# Patient Record
Sex: Female | Born: 1965 | Race: White | Hispanic: No | Marital: Single | Smoking: Never smoker
Health system: Southern US, Community
[De-identification: ages and names within clinical notes are randomized; demographics above are authoritative.]

## PROBLEM LIST (undated history)

## (undated) DIAGNOSIS — K219 Gastro-esophageal reflux disease without esophagitis: Secondary | ICD-10-CM

## (undated) DIAGNOSIS — H3500 Unspecified background retinopathy: Secondary | ICD-10-CM

## (undated) DIAGNOSIS — G4730 Sleep apnea, unspecified: Secondary | ICD-10-CM

## (undated) DIAGNOSIS — F419 Anxiety disorder, unspecified: Secondary | ICD-10-CM

## (undated) DIAGNOSIS — Z803 Family history of malignant neoplasm of breast: Secondary | ICD-10-CM

## (undated) DIAGNOSIS — Z87442 Personal history of urinary calculi: Secondary | ICD-10-CM

## (undated) DIAGNOSIS — G629 Polyneuropathy, unspecified: Secondary | ICD-10-CM

## (undated) DIAGNOSIS — Z1379 Encounter for other screening for genetic and chromosomal anomalies: Secondary | ICD-10-CM

## (undated) DIAGNOSIS — R0600 Dyspnea, unspecified: Secondary | ICD-10-CM

## (undated) DIAGNOSIS — F329 Major depressive disorder, single episode, unspecified: Secondary | ICD-10-CM

## (undated) DIAGNOSIS — E119 Type 2 diabetes mellitus without complications: Secondary | ICD-10-CM

## (undated) DIAGNOSIS — I739 Peripheral vascular disease, unspecified: Secondary | ICD-10-CM

## (undated) DIAGNOSIS — E785 Hyperlipidemia, unspecified: Secondary | ICD-10-CM

## (undated) DIAGNOSIS — Z136 Encounter for screening for cardiovascular disorders: Secondary | ICD-10-CM

## (undated) DIAGNOSIS — M1990 Unspecified osteoarthritis, unspecified site: Secondary | ICD-10-CM

## (undated) DIAGNOSIS — F32A Depression, unspecified: Secondary | ICD-10-CM

## (undated) HISTORY — DX: Family history of malignant neoplasm of breast: Z80.3

## (undated) HISTORY — PX: COLONOSCOPY: SHX174

## (undated) HISTORY — PX: BREAST SURGERY: SHX581

## (undated) HISTORY — PX: OTHER SURGICAL HISTORY: SHX169

## (undated) HISTORY — PX: WISDOM TOOTH EXTRACTION: SHX21

## (undated) HISTORY — DX: Encounter for other screening for genetic and chromosomal anomalies: Z13.79

## (undated) HISTORY — PX: UPPER GI ENDOSCOPY: SHX6162

---
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
## 2011-10-21 ENCOUNTER — Encounter (HOSPITAL_COMMUNITY): Payer: Self-pay | Admitting: *Deleted

## 2011-10-21 ENCOUNTER — Emergency Department (HOSPITAL_COMMUNITY): Payer: BC Managed Care – PPO

## 2011-10-21 ENCOUNTER — Emergency Department (HOSPITAL_COMMUNITY)
Admission: EM | Admit: 2011-10-21 | Discharge: 2011-10-21 | Disposition: A | Discharge status: Home / Self Care | Payer: BC Managed Care – PPO | Attending: Emergency Medicine | Admitting: Emergency Medicine

## 2011-10-21 DIAGNOSIS — N839 Noninflammatory disorder of ovary, fallopian tube and broad ligament, unspecified: Secondary | ICD-10-CM | POA: Insufficient documentation

## 2011-10-21 DIAGNOSIS — R739 Hyperglycemia, unspecified: Secondary | ICD-10-CM

## 2011-10-21 DIAGNOSIS — R7309 Other abnormal glucose: Secondary | ICD-10-CM | POA: Insufficient documentation

## 2011-10-21 DIAGNOSIS — R109 Unspecified abdominal pain: Secondary | ICD-10-CM

## 2011-10-21 DIAGNOSIS — R1011 Right upper quadrant pain: Secondary | ICD-10-CM | POA: Insufficient documentation

## 2011-10-21 DIAGNOSIS — N838 Other noninflammatory disorders of ovary, fallopian tube and broad ligament: Secondary | ICD-10-CM

## 2011-10-21 DIAGNOSIS — N200 Calculus of kidney: Secondary | ICD-10-CM

## 2011-10-21 DIAGNOSIS — N1330 Unspecified hydronephrosis: Secondary | ICD-10-CM | POA: Insufficient documentation

## 2011-10-21 LAB — CBC
MCV: 90.2 fL (ref 78.0–100.0)
Platelets: 226 10*3/uL (ref 150–400)
RBC: 4.6 MIL/uL (ref 3.87–5.11)
WBC: 13 10*3/uL — ABNORMAL HIGH (ref 4.0–10.5)

## 2011-10-21 LAB — COMPREHENSIVE METABOLIC PANEL
ALT: 16 U/L (ref 0–35)
Alkaline Phosphatase: 77 U/L (ref 39–117)
CO2: 21 mEq/L (ref 19–32)
Chloride: 98 mEq/L (ref 96–112)
GFR calc Af Amer: >90 mL/min (ref >90)
GFR calc non Af Amer: >90 mL/min (ref >90)
Glucose, Bld: 289 mg/dL — ABNORMAL HIGH (ref 70–99)
Potassium: 4.8 mEq/L (ref 3.5–5.1)
Sodium: 135 mEq/L (ref 135–145)

## 2011-10-21 LAB — URINE MICROSCOPIC-ADD ON

## 2011-10-21 LAB — URINALYSIS, ROUTINE W REFLEX MICROSCOPIC
Bilirubin Urine: NEGATIVE
Specific Gravity, Urine: 1.029 (ref 1.005–1.030)
Urobilinogen, UA: 0.2 mg/dL (ref 0.0–1.0)

## 2011-10-21 LAB — DIFFERENTIAL
Lymphocytes Relative: 12 % (ref 12–46)
Lymphs Abs: 1.6 10*3/uL (ref 0.7–4.0)
Neutrophils Relative %: 78 % — ABNORMAL HIGH (ref 43–77)

## 2011-10-21 MED ORDER — MORPHINE SULFATE 4 MG/ML IJ SOLN
4.0000 mg | Freq: Once | INTRAMUSCULAR | Status: AC
  Administered 2011-10-21: 4 mg via INTRAVENOUS
  Filled 2011-10-21: qty 1

## 2011-10-21 MED ORDER — METFORMIN HCL 500 MG PO TABS: 500.0000 mg | ORAL_TABLET | Freq: Two times a day (BID) | ORAL | Status: DC

## 2011-10-21 MED ORDER — ONDANSETRON HCL 4 MG PO TABS: 4.0000 mg | ORAL_TABLET | Freq: Four times a day (QID) | ORAL | Status: AC

## 2011-10-21 MED ORDER — TRAMADOL HCL 50 MG PO TABS: 50.0000 mg | ORAL_TABLET | Freq: Four times a day (QID) | ORAL | Status: AC | PRN

## 2011-10-21 NOTE — ED Notes (Signed)
Pt returned from [place]. Pt advised to remain NPO until after [place] results.

## 2011-10-21 NOTE — Discharge Instructions (Signed)
You have been evaluated for your flank pain. CT scan shows evidence of a recently passed kidney stone which made be related to your pain. Your pain should improve. The scan also shows an undifferentiated mass in your ovary.  A pelvic ultrasound was performed showing a hemorrhagic ovarian cyst.  [facility] to recheck for resolution.  Your blood sugar is high today, please take metformin, monitor your diet, perform exercise at least 3 times weekly and follow up with a regular doctor for further care.  Resources given here.   Flank Pain Flank pain refers to pain that is located on the side of the body between the upper abdomen and the back. It can be caused by many things. CAUSES  Some of the more common causes of flank pain include:  Muscle strain.   Muscle spasms.   A disease of your spine (vertebral disk disease).   A lung infection (pneumonia).   Fluid around your lungs (pulmonary edema).   A kidney infection.   Kidney stones.   A very painful skin rash on only one side of your body (shingles).   Gallbladder disease.  DIAGNOSIS  Blood tests, urine tests, and X-rays may help your caregiver determine what is wrong. TREATMENT  The treatment of pain depends on the cause. Your caregiver will determine what treatment will work best for you. HOME CARE INSTRUCTIONS   Home care will depend on the cause of your pain.   Some medications may help relieve the pain. Take medication for relief of pain as directed by your caregiver.   Tell your caregiver about any changes in your pain.   Follow up with your caregiver.  SEEK IMMEDIATE MEDICAL CARE IF:   Your pain is not controlled with medication.   The pain increases.   You have abdominal pain.   You have shortness of breath.   You have persistent nausea or vomiting.   You have swelling in your abdomen.   You feel faint or pass out.   You have a temperature by mouth above 102 F (38.9 C),  not controlled by medicine.  MAKE SURE YOU:   Understand these instructions.   Will watch your condition.   Will get help right away if you are not doing well or get worse.  Document Released: 06/21/2005 Document Revised: 04/19/2011 Document Reviewed: 10/15/2009 [facility] Patient Information 2012 [place], [place].  RESOURCE GUIDE  Chronic Pain Problems: Contact [facility]  [phone] Patients need to be referred by their primary care doctor.  Insufficient Money for Medicine: Contact United Way:  call "211" or [facility] 4026312805.  No Primary Care Doctor: - Call [facility]  [phone] - can help you locate a primary care doctor that  accepts your insurance, provides certain services, etc. - Physician Referral [phone]  Agencies that provide inexpensive medical care: - [facility]  [phone] - [facility]  [phone] - [facility]  [phone] [facility]  [phone] - Planned Parenthood  [phone] [facility]  [phone]  Medicaid-accepting [facility] Providers: - [facility]- [address]  [phone], Mon-Fri 9am-7pm, Sat 9am-1pm - [facility]- [address] [place], [address] [place]  [phone] - [facility]- [address], [address] [place]  [phone] [facility]- [address]  [phone] - Renaye Rakers- [address]  [address], [phone]  Only accepts [place] [employer] patients after they have their name  applied to their card  Self Pay (no insurance) in [facility]: - Sickle Cell Patients: Dr Willey Blade, [facility]  [address] [place], [phone] - [facility]- [address] [place]  [phone]       [facility] [place]- [address]       -     [facility]- see information above (Speak to [employer]  if you do not have insurance)       -  [facility]- [address] [place], [phone]       -  [facility]- [address],  [phone]       -  [facility]- [address] [phone]       -  Dr Julio Sicks-  [address], [place], [phone]       -  [facility]- [address] [phone]       -  [facility]- [address], [phone], also [address] [phone]       -    [facility]- [address] [place], [phone], 1st & 3rd Saturday   every month, 10am-1pm  1) Find a Doctor and Pay Out of Pocket Although you won't have to find out who is covered by your insurance plan, it is a good idea to ask around and get recommendations. You will then need to call the office and see if the doctor you have chosen will accept you as a new patient and what types of options they offer for patients who are self-pay. Some doctors offer discounts or will set up payment plans for their patients who do not have insurance, but you will need to ask so you aren't surprised when you get to your appointment.  2) Contact Your Local Health Department Not all health departments have doctors that can see patients for sick visits, but many do, so it is worth a call to see if yours does. If you don't know where your local health department is, you can check in your phone book. The CDC also has a tool to help you locate your state's health department, and many state websites also have listings of all of their local health departments.  3) Find a Walk-in Clinic If your illness is not likely to be very severe or complicated, you may want to try a walk in clinic. These are popping up all over the country in pharmacies, drugstores, and shopping centers. They're usually staffed by nurse practitioners or physician assistants that have been trained to treat common illnesses and complaints. They're usually fairly quick and inexpensive. However, if  you have serious medical issues or chronic medical problems, these are probably not your best option  STD Testing - [facility] Department of [facility], STD Clinic, [address] [place], phone [phone] or [phone].  Monday - Friday, call for an appointment. [facility] of [employer], STD Clinic, [address], [place], phone [phone] or [phone].  Monday - Friday, call for an appointment.  Abuse/Neglect: [facility] Child Abuse Hotline (432) [phone] [facility] Child Abuse Hotline (646)276-6654 (After Hours)  Emergency Shelter:  [facility] 220 726 2827  Maternity Homes: - Room at  the [place] [facility] [phone] - [facility] [phone]  MRSA Hotline #:   236 269 3544  [facility] Resources  [facility] [place]  United Way [facility] Dept. [address]                 [address]         [address] [place] [address]  Blondell Reveal Phone:  [phone]                                  Phone:  [phone]                   Phone:  [phone]  [facility], [phone] - [facility] - [phone]       -     [facility] [place] in [place], [address]                                  [phone], [facility] Child Abuse Hotline 2515136571 or 2795785800 (After Hours)   Behavioral Health Services  Substance Abuse Resources: - [facility] and Drug Services  (810) [phone] - [facility] [phone] - [facility] [phone] Floydene Flock [phone] - Residential & Outpatient Substance Abuse Program  [phone]  Psychological Services: [facility]  [phone] Services  [phone] - [facility], [phone] [place]. [address], [place], ACCESS LINE: [phone] or [phone], [email or web address]  Dental Assistance  If unable to pay or uninsured, contact:  [facility] or [facility]. to become qualified for the adult dental clinic.  Patients with Medicaid: [facility] [phone] [address], [phone] [address] [phone]  If unable to pay, or uninsured, contact [facility] [phone]) or [facility] [phone] in [place], [phone] in [facility]) to become qualified for the adult dental clinic  Other Low-Cost Community Dental Services: - [facility]- [address] [place], [place], [place], [address], [phone], [address]. [phone], 2nd and 4th Thursday of the month at 6:30am.  10 clients each day by appointment, can sometimes see walk-in patients if someone does not show for an appointment. [facility]- [address] [place], [place], [address], [phone] - [facility]- [address] [place], [place], [address], [phone] - [facility]- [phone] [facility] Health Department- [phone] [facility] [phone] -

## 2011-10-21 NOTE — ED Notes (Signed)
Reports having intermittent right side flank pain for several days, having n/v. Denies urinary symptoms.

## 2011-10-21 NOTE — ED Notes (Signed)
Pt reporting right sided flank pain. Pain is intermittent. Right side tender to palpation. Denying any hematuria but just came off period so hard to assess per pt. Reporting nausea no vomiting. No hx of kidney stone

## 2011-10-21 NOTE — ED Provider Notes (Signed)
History     CSN: [id number]  Arrival date & time 10/21/11  1455   First MD Initiated Contact with Patient 10/21/11 1709      Chief Complaint  Patient presents with  . Flank Pain    (Consider location/radiation/quality/duration/timing/severity/associated sxs/prior treatment) HPI  46 year old female presents complaining of right flank pain. Patient states for the past 2 days she has had intermittent right flank pain. Pain is described as sharp and achy sensations radiates to her right upper quadrant and to the right mid back.  Pain usually lasting for hours and resolved. Pain is not correlated with positional change or with eating. She has some associated nausea without vomiting, or diarrhea. She denies fever, headache, chills, chest pain, shortness of breath, urinary symptoms, or rash. Patient still has her gallbladder. Patient denies prior history of kidney stones. Patient has not tried anything to help alleviate the symptoms.  History reviewed. No pertinent past medical history.  History reviewed. No pertinent past surgical history.  History reviewed. No pertinent family history.  History  Substance Use Topics  . Smoking status: Not on file  . Smokeless tobacco: Not on file  . Alcohol Use: No    OB History    Grav Para Term Preterm Abortions TAB SAB Ect Mult Living                  Review of Systems  All other systems reviewed and are negative.    Allergies  Review of patient's allergies indicates no known allergies.  Home Medications   Current Outpatient Rx  Name Route Sig Dispense Refill  . ACETAMINOPHEN 500 MG PO TABS Oral Take 1,500 mg by mouth every 6 (six) hours as needed. For pain    . ASPIRIN-ACETAMINOPHEN-CAFFEINE 250-250-65 MG PO TABS Oral Take 1 tablet by mouth every 6 (six) hours as needed. For general pain    . PERCOCET PO Oral Take 1 tablet by mouth every 6 (six) hours as needed. For pain      BP 178/96  Pulse 105  Temp 98.3 F (36.8 C)  Resp  18  SpO2 98%  Physical Exam  Nursing note and vitals reviewed. Constitutional: She is oriented to person, place, and time. She appears well-nourished.       Morbidly obese  HENT:  Head: Normocephalic and atraumatic.  Mouth/Throat: Oropharynx is clear and moist. No oropharyngeal exudate.  Eyes: Conjunctivae are normal.  Neck: Neck supple.  Cardiovascular: Normal rate and regular rhythm.   Pulmonary/Chest: Effort normal and breath sounds normal. No respiratory distress. She has no wheezes. She has no rales. She exhibits no tenderness.  Abdominal: Soft. There is tenderness in the right upper quadrant. There is no rigidity, no rebound, no guarding, no CVA tenderness, no tenderness at McBurney's point and negative Murphy's sign.  Musculoskeletal: She exhibits no edema.  Neurological: She is alert and oriented to person, place, and time.  Skin: Skin is warm. No rash noted.    ED Course  Procedures (including critical care time)  Labs Reviewed  URINALYSIS, ROUTINE W REFLEX MICROSCOPIC - Abnormal; Notable for the following:    Color, Urine AMBER (*) BIOCHEMICALS MAY BE AFFECTED BY COLOR   APPearance CLOUDY (*)    Glucose, UA >1000 (*)    Hgb urine dipstick LARGE (*)    Ketones, ur >80 (*)    Protein, ur 100 (*)    Leukocytes, UA SMALL (*)    All other components within normal limits  URINE MICROSCOPIC-ADD ON  No results found.   No diagnosis found.  Results for orders placed during the hospital encounter of 10/21/11  URINALYSIS, ROUTINE W REFLEX MICROSCOPIC      Component Value Range   Color, Urine AMBER (*) YELLOW    APPearance CLOUDY (*) CLEAR    Specific Gravity, Urine 1.029  1.005 - 1.030    pH 5.5  5.0 - 8.0    Glucose, UA >1000 (*) NEGATIVE (mg/dL)   Hgb urine dipstick LARGE (*) NEGATIVE    Bilirubin Urine NEGATIVE  NEGATIVE    Ketones, ur >80 (*) NEGATIVE (mg/dL)   Protein, ur 409 (*) NEGATIVE (mg/dL)   Urobilinogen, UA 0.2  0.0 - 1.0 (mg/dL)   Nitrite NEGATIVE   NEGATIVE    Leukocytes, UA SMALL (*) NEGATIVE   URINE MICROSCOPIC-ADD ON      Component Value Range   Squamous Epithelial / LPF RARE  RARE    WBC, UA 7-10  <3 (WBC/hpf)   RBC / HPF TOO NUMEROUS TO COUNT  <3 (RBC/hpf)  PREGNANCY, URINE      Component Value Range   Preg Test, Ur NEGATIVE  NEGATIVE   CBC      Component Value Range   WBC 13.0 (*) 4.0 - 10.5 (K/uL)   RBC 4.60  3.87 - 5.11 (MIL/uL)   Hemoglobin 14.8  12.0 - 15.0 (g/dL)   HCT 81.1  91.4 - 78.2 (%)   MCV 90.2  78.0 - 100.0 (fL)   MCH 32.2  26.0 - 34.0 (pg)   MCHC 35.7  30.0 - 36.0 (g/dL)   RDW 95.6  21.3 - 08.6 (%)   Platelets 226  150 - 400 (K/uL)  DIFFERENTIAL      Component Value Range   Neutrophils Relative 78 (*) 43 - 77 (%)   Neutro Abs 10.1 (*) 1.7 - 7.7 (K/uL)   Lymphocytes Relative 12  12 - 46 (%)   Lymphs Abs 1.6  0.7 - 4.0 (K/uL)   Monocytes Relative 10  3 - 12 (%)   Monocytes Absolute 1.3 (*) 0.1 - 1.0 (K/uL)   Eosinophils Relative 0  0 - 5 (%)   Eosinophils Absolute 0.0  0.0 - 0.7 (K/uL)   Basophils Relative 0  0 - 1 (%)   Basophils Absolute 0.0  0.0 - 0.1 (K/uL)  COMPREHENSIVE METABOLIC PANEL      Component Value Range   Sodium 135  135 - 145 (mEq/L)   Potassium 4.8  3.5 - 5.1 (mEq/L)   Chloride 98  96 - 112 (mEq/L)   CO2 21  19 - 32 (mEq/L)   Glucose, Bld 289 (*) 70 - 99 (mg/dL)   BUN 9  6 - 23 (mg/dL)   Creatinine, Ser 5.78 (*) 0.50 - 1.10 (mg/dL)   Calcium 9.8  8.4 - 46.9 (mg/dL)   Total Protein 8.2  6.0 - 8.3 (g/dL)   Albumin 3.5  3.5 - 5.2 (g/dL)   AST 23  0 - 37 (U/L)   ALT 16  0 - 35 (U/L)   Alkaline Phosphatase 77  39 - 117 (U/L)   Total Bilirubin 0.3  0.3 - 1.2 (mg/dL)   GFR calc non Af Amer >90  >90 (mL/min)   GFR calc Af Amer >90  >90 (mL/min)  LIPASE, BLOOD      Component Value Range   Lipase 21  11 - 59 (U/L)   Ct Abdomen Pelvis Wo Contrast  10/21/2011  *RADIOLOGY REPORT*  Clinical Data:  Intermittent right-sided  flank pain for several days.  Nausea vomiting.  CT ABDOMEN AND  PELVIS WITHOUT CONTRAST (CT UROGRAM)  Technique: Contiguous axial images of the abdomen and pelvis without oral or intravenous contrast were obtained.  Comparison: None  Findings:  Exam is limited for evaluation of entities other than urinary tract calculi due to lack of oral or intravenous contrast.   Clear lung bases.  Normal heart size without pericardial or pleural effusion.  Moderate hepatic steatosis and hepatomegaly. 19.9 cm.  Normal spleen, stomach, pancreas, gallbladder, biliary tract, adrenal glands.  No renal calculi.  Moderate obstructive signs involve the right kidney.  Right hydroureter.  No obstructive ureteric stone identified.  No retroperitoneal or retrocrural adenopathy.  Normal colon, appendix, and terminal ileum.  Normal small bowel without abdominal ascites.  No pelvic adenopathy.  Normal urinary bladder.  Likely arising from the right ovary, within the right adnexa, is a 6.0 x 6.7 cm hyperattenuating "mass" on image 69.  There is low-density material adjacent which could represent ovarian parenchyma or less likely adnexal fluid.  Within the left adnexa/ovary, an area of hyperattenuation measures 3.5 cm on image 72.  No significant free fluid.  No acute osseous abnormality.  IMPRESSION:  1.  Mild to moderate right-sided urinary tract obstruction.  No obstructive stone identified.  Favor recent stone passage. 2.  Right adnexal/ovarian mass is indeterminate.  Recommend further evaluation with pelvic ultrasound.  This could be performed as an outpatient, or if there are symptoms attributable to the ovary, more acutely.  The hydronephrosis is not felt to be associated with the right ovarian lesion, given the distal mild right hydroureter. 3.  Probable hemorrhagic cyst in the left ovary.  This could also be evaluated at ultrasound. 4.  Hepatomegaly and hepatic steatosis.  Original Report Authenticated By: Consuello Bossier, M.D.   US Pelvis Complete Status:  Final result         PACS Images      Show images for US Pelvis Complete         Study Result     *RADIOLOGY REPORT*  Clinical Data: Adnexal mass on CT.  TRANSABDOMINAL ULTRASOUND OF PELVIS  Technique: Transabdominal ultrasound examination of the pelvis was  performed including evaluation of the uterus, ovaries, adnexal  regions, and pelvic cul-de-sac.  Comparison: CT of earlier today.  Findings: Uterus measures 9.0 x 4.3 x 5.3 cm. Equivocal anterior  lower uterine segment fibroid measuring 2.4 cm on image 53.  The endometrium is normal, 5 mm.  The right ovary measures 7.4 x 6.4 x 9.4 cm. Within the right  ovary is a hypoechoic avascular mildly heterogeneous lesion which  measures 6.5 x 5.5 x 6.1 cm.  The left ovary measures 5.7 x 3.4 x 3.5 cm. Within the left ovary  is a 2.3 cm area of hypoechogenicity.  No significant free fluid.  IMPRESSION:  1. The patient was evaluated only transabdominally. Transvaginal  imaging was refused by patient. This degrades the exam.  2. Dominant right ovarian mass which is favored to represent a  hemorrhagic cyst. Follow-up ultrasound at 6 - 12 weeks recommended  to confirm resolution.  3. Probable hemorrhagic follicle within the left ovary.  4. Possible small lower uterine segment fibroid.  Original Report Authenticated By: Consuello Bossier, M.D.      MDM  Patient with right flank pain. She has no CVA tenderness. She does have mild tenderness to her right upper quadrant. She is otherwise in no acute distress. Her urine is  significant for large amount of red blood cells, however the patient states she has her menstruation which ended a few days ago. She denies any urinary symptoms.  Pain to obtain an abdominal and pelvis CT scan to rule out kidney stones. If negative will consider abdominal ultrasound to rule out gallbladder etiology.  7:25 PM Abdominal and pelvis CT scan shows a mild to moderate right side urinary tract obstruction which favors a recent kidney stone passage.  This symptom is consistent with patient's present complaint. She is currently pain-free. There is also a right adnexal ovarian mass that was found on the CT scan. I recommend for patient to followup with an OB/GYN for further evaluation. Patient also has elevated blood sugar. She will need to be checked to rule out diabetes but this can be done through her primary care Dr. Strict return precautions were discussed.  9:49 PM Pelvic US depict the dominant right ovarian mass as a hemorrhagic cyst.  Recommend pt to f/u with GYN in 6-12 weeks for resolution.  Result discussed with pt.  Pt stable to be d/c.  Able to tolerates PO.     Fayrene Helper, PA-C 10/21/11 2152

## 2011-10-22 ENCOUNTER — Encounter (HOSPITAL_COMMUNITY): Payer: Self-pay | Admitting: *Deleted

## 2011-10-22 ENCOUNTER — Emergency Department (HOSPITAL_COMMUNITY)
Admission: EM | Admit: 2011-10-22 | Discharge: 2011-10-23 | Disposition: A | Discharge status: Home / Self Care | Payer: BC Managed Care – PPO | Attending: Emergency Medicine | Admitting: Emergency Medicine

## 2011-10-22 DIAGNOSIS — R7309 Other abnormal glucose: Secondary | ICD-10-CM | POA: Insufficient documentation

## 2011-10-22 DIAGNOSIS — N390 Urinary tract infection, site not specified: Secondary | ICD-10-CM

## 2011-10-22 DIAGNOSIS — R109 Unspecified abdominal pain: Secondary | ICD-10-CM

## 2011-10-22 DIAGNOSIS — Z87442 Personal history of urinary calculi: Secondary | ICD-10-CM | POA: Insufficient documentation

## 2011-10-22 DIAGNOSIS — R739 Hyperglycemia, unspecified: Secondary | ICD-10-CM

## 2011-10-22 LAB — URINE MICROSCOPIC-ADD ON

## 2011-10-22 LAB — URINALYSIS, ROUTINE W REFLEX MICROSCOPIC
Bilirubin Urine: NEGATIVE
Nitrite: NEGATIVE
Protein, ur: 30 mg/dL — AB
Specific Gravity, Urine: 1.034 — ABNORMAL HIGH (ref 1.005–1.030)
Urobilinogen, UA: 0.2 mg/dL (ref 0.0–1.0)

## 2011-10-22 NOTE — ED Notes (Signed)
C/o R flank and RUQ pain, seen here recently for the same, dx'd with kidney stones, returns for "not feeling better", c/o R flank pain, took pain pill at 2030 w/o relief.  Pain now has eased off while here. "Not sure if she has developed subsequent UTI". Mentions intermitant and severe pain and intermitant urinary sx. Describes scant urine output, retention, frequency, urgency. (denies: fever, nv, noticeable hematuria, dark urine ("it is lighter now") or dysuria).

## 2011-10-23 MED ORDER — CIPROFLOXACIN HCL 500 MG PO TABS
500.0000 mg | ORAL_TABLET | Freq: Once | ORAL | Status: AC
  Administered 2011-10-23: 500 mg via ORAL
  Filled 2011-10-23: qty 1

## 2011-10-23 MED ORDER — CIPROFLOXACIN HCL 500 MG PO TABS: 500.0000 mg | ORAL_TABLET | Freq: Two times a day (BID) | ORAL | Status: AC

## 2011-10-23 NOTE — Discharge Instructions (Signed)
Take ultram around the clock for the next 2 days.  Take antibiotics as prescribed.  Follow up with a local primary care doctor.  Return to the ER for worsening pain, fevers, vomiting, or other concerning symptoms.  Drink plenty of water, and/or cranberry juice that does not contain a lot of sugar.  Abdominal Pain Many things can cause belly (abdominal) pain. Most times, the belly pain is not dangerous. The amount of belly pain does not tell how serious the problem may be. Many cases of belly pain can be watched and treated at home. HOME CARE   Do not take medicines that help you go poop (laxatives) unless told to by your doctor.   Only take medicine as told by your doctor.   Eat or drink as told by your doctor. Your doctor will tell you if you should be on a special diet.  GET HELP RIGHT AWAY IF:   The pain does not go away.   You have a fever.   You keep throwing up (vomiting).   The pain changes and is only in the right or left part of the belly.   You have bloody or tarry looking poop.  MAKE SURE YOU:   Understand these instructions.   Will watch your condition.   Will get help right away if you are not doing well or get worse.  Document Released: 10/17/2007 Document Revised: 04/19/2011 Document Reviewed: 05/16/2009 [facility] Patient Information 2012 [place], [place].  Urinary Tract Infection Infections of the urinary tract can start in several places. A bladder infection (cystitis), a kidney infection (pyelonephritis), and a prostate infection (prostatitis) are different types of urinary tract infections (UTIs). They usually get better if treated with medicines (antibiotics) that kill germs. Take all the medicine until it is gone. You or your child may feel better in a few days, but TAKE ALL MEDICINE or the infection may not respond and may become more difficult to treat. HOME CARE INSTRUCTIONS   Drink enough water and fluids to keep the urine clear or pale yellow. Cranberry  juice is especially recommended, in addition to large amounts of water.   Avoid caffeine, tea, and carbonated beverages. They tend to irritate the bladder.   Alcohol may irritate the prostate.   Only take over-the-counter or prescription medicines for pain, discomfort, or fever as directed by your caregiver.  To prevent further infections:  Empty the bladder often. Avoid holding urine for long periods of time.   After a bowel movement, women should cleanse from front to back. Use each tissue only once.   Empty the bladder before and after sexual intercourse.  FINDING OUT THE RESULTS OF YOUR TEST Not all test results are available during your visit. If your or your child's test results are not back during the visit, make an appointment with your caregiver to find out the results. Do not assume everything is normal if you have not heard from your caregiver or the medical facility. It is important for you to follow up on all test results. SEEK MEDICAL CARE IF:   There is back pain.   Your baby is older than 3 months with a rectal temperature of 100.5 F (38.1 C) or higher for more than 1 day.   Your or your child's problems (symptoms) are no better in 3 days. Return sooner if you or your child is getting worse.  SEEK IMMEDIATE MEDICAL CARE IF:   There is severe back pain or lower abdominal pain.   You  or your child develops chills.   You have a fever.   Your baby is older than 3 months with a rectal temperature of 102 F (38.9 C) or higher.   Your baby is 31 months old or younger with a rectal temperature of 100.4 F (38 C) or higher.   There is nausea or vomiting.   There is continued burning or discomfort with urination.  MAKE SURE YOU:   Understand these instructions.   Will watch your condition.   Will get help right away if you are not doing well or get worse.  Document Released: 02/07/2005 Document Revised: 04/19/2011 Document Reviewed: 09/12/2006 [facility] Patient  Information 2012 [place], [place].

## 2011-10-23 NOTE — ED Provider Notes (Signed)
Medical screening examination/treatment/procedure(s) were conducted as a shared visit with non-physician practitioner(s) and myself.  I personally evaluated the patient during the encounter  Pt with right flank pain, is diabetic.  Lab workup showed UTI, but also showed R adnexal mass.  Abdominal ultrasound showed what appeared to be a right hemorrhagic ovarian cyst.  Rx for UTI, GYN F/U in 6-12 weeks to check for resolution of the ovarian cyst.    Carleene Cooper III, MD 10/23/11 [phone]

## 2011-10-24 LAB — URINE CULTURE

## 2011-10-24 NOTE — ED Provider Notes (Signed)
History     CSN: [id number]  Arrival date & time 10/22/11  2223   First MD Initiated Contact with Patient 10/23/11 0008      Chief Complaint  Patient presents with  . Flank Pain    Right    (Consider location/radiation/quality/duration/timing/severity/associated sxs/prior treatment) HPI 46 year old female presents emergency department complaining of right flank and right upper abdominal pain. Patient seen yesterday for same, thought to have passed a kidney stone recently. Patient reports she has had increasing dysuria over the last 24 hours, and feels she may have a urinary tract infection. Patient reports pain started up this evening around 6 PM. She took tramadol for the first time without significant improvement in her symptoms. Patient has reports pain has since eased off and she is feeling much more comfortable. She has had no fevers, vomiting. She is having normal bowel movements.   Past Medical History  Diagnosis Date  . Kidney stone     Past Surgical History  Procedure Date  . Tailbone fx & subsequent cyst with excision     No family history on file.  History  Substance Use Topics  . Smoking status: Not on file  . Smokeless tobacco: Not on file  . Alcohol Use: No    OB History    Grav Para Term Preterm Abortions TAB SAB Ect Mult Living                  Review of Systems  All other systems reviewed and are negative.    Allergies  Review of patient's allergies indicates no known allergies.  Home Medications   Current Outpatient Rx  Name Route Sig Dispense Refill  . METFORMIN HCL 500 MG PO TABS Oral Take 1 tablet (500 mg total) by mouth 2 (two) times daily with a meal. 30 tablet 0  . TRAMADOL HCL 50 MG PO TABS Oral Take 1 tablet (50 mg total) by mouth every 6 (six) hours as needed for pain. 15 tablet 0  . CIPROFLOXACIN HCL 500 MG PO TABS Oral Take 1 tablet (500 mg total) by mouth 2 (two) times daily. 14 tablet 0  . ONDANSETRON HCL 4 MG PO TABS Oral Take  1 tablet (4 mg total) by mouth every 6 (six) hours. 12 tablet 0    BP 147/86  Pulse 102  Temp 98.4 F (36.9 C) (Oral)  Resp 19  SpO2 95%  LMP 10/16/2011  Physical Exam  Nursing note and vitals reviewed. Constitutional: She appears well-developed and well-nourished. No distress.  HENT:  Head: Normocephalic and atraumatic.  Eyes: Conjunctivae and EOM are normal. Pupils are equal, round, and reactive to light.  Neck: Normal range of motion. Neck supple. No JVD present. No thyromegaly present.  Cardiovascular: Regular rhythm, normal heart sounds and intact distal pulses.        Tachycardia noted  Pulmonary/Chest: Effort normal and breath sounds normal. No stridor. No respiratory distress. She has no wheezes. She has no rales. She exhibits no tenderness.  Abdominal: Soft. Bowel sounds are normal. She exhibits no distension and no mass. There is no tenderness. There is no rebound and no guarding.  Musculoskeletal: Normal range of motion. She exhibits no edema and no tenderness.  Lymphadenopathy:    She has no cervical adenopathy.  Psychiatric: She has a normal mood and affect. Her behavior is normal. Judgment and thought content normal.    ED Course  Procedures (including critical care time)  Labs Reviewed  URINALYSIS, ROUTINE W REFLEX MICROSCOPIC -  Abnormal; Notable for the following:    APPearance CLOUDY (*)     Specific Gravity, Urine 1.034 (*)     Glucose, UA >1000 (*)     Hgb urine dipstick LARGE (*)     Ketones, ur 15 (*)     Protein, ur 30 (*)     Leukocytes, UA SMALL (*)     All other components within normal limits  URINE MICROSCOPIC-ADD ON - Abnormal; Notable for the following:    Squamous Epithelial / LPF FEW (*)     Bacteria, UA FEW (*)     All other components within normal limits  LAB REPORT - SCANNED   No results found.   1. Right flank pain   2. Urinary tract infection   3. Hyperglycemia       MDM  46 year old female whose urine now shows increasing  white blood cells. Urine culture is still pending. Will treat with Cipro. Patient to followup with primary care Dr. as previously scheduled.        Olivia Mackie, MD 10/24/11 [phone]

## 2011-10-25 NOTE — ED Notes (Signed)
+   Urine Patient treated with Cipro-sensitive to same-chart appended per protocol MD. 

## 2014-10-01 ENCOUNTER — Emergency Department (HOSPITAL_COMMUNITY): Payer: BLUE CROSS/BLUE SHIELD

## 2014-10-01 ENCOUNTER — Encounter (HOSPITAL_COMMUNITY): Payer: Self-pay | Admitting: Family Medicine

## 2014-10-01 ENCOUNTER — Emergency Department (HOSPITAL_COMMUNITY)
Admission: EM | Admit: 2014-10-01 | Discharge: 2014-10-01 | Disposition: A | Discharge status: Home / Self Care | Payer: BLUE CROSS/BLUE SHIELD | Attending: Emergency Medicine | Admitting: Emergency Medicine

## 2014-10-01 DIAGNOSIS — Z87442 Personal history of urinary calculi: Secondary | ICD-10-CM | POA: Insufficient documentation

## 2014-10-01 DIAGNOSIS — R1031 Right lower quadrant pain: Secondary | ICD-10-CM | POA: Diagnosis not present

## 2014-10-01 DIAGNOSIS — E1165 Type 2 diabetes mellitus with hyperglycemia: Secondary | ICD-10-CM

## 2014-10-01 DIAGNOSIS — R112 Nausea with vomiting, unspecified: Secondary | ICD-10-CM | POA: Insufficient documentation

## 2014-10-01 DIAGNOSIS — Z79899 Other long term (current) drug therapy: Secondary | ICD-10-CM | POA: Diagnosis not present

## 2014-10-01 DIAGNOSIS — R109 Unspecified abdominal pain: Secondary | ICD-10-CM

## 2014-10-01 DIAGNOSIS — Z3202 Encounter for pregnancy test, result negative: Secondary | ICD-10-CM | POA: Insufficient documentation

## 2014-10-01 LAB — CBC WITH DIFFERENTIAL/PLATELET
BASOS ABS: 0 10*3/uL (ref 0.0–0.1)
BASOS PCT: 0 % (ref 0–1)
Eosinophils Absolute: 0 10*3/uL (ref 0.0–0.7)
Eosinophils Relative: 0 % (ref 0–5)
HEMATOCRIT: 43.1 % (ref 36.0–46.0)
Hemoglobin: 15 g/dL (ref 12.0–15.0)
Lymphocytes Relative: 17 % (ref 12–46)
Lymphs Abs: 1.6 10*3/uL (ref 0.7–4.0)
MCH: 31.3 pg (ref 26.0–34.0)
MCHC: 34.8 g/dL (ref 30.0–36.0)
MCV: 90 fL (ref 78.0–100.0)
MONO ABS: 0.3 10*3/uL (ref 0.1–1.0)
Monocytes Relative: 3 % (ref 3–12)
NEUTROS PCT: 80 % — AB (ref 43–77)
Neutro Abs: 7.2 10*3/uL (ref 1.7–7.7)
Platelets: 223 10*3/uL (ref 150–400)
RBC: 4.79 MIL/uL (ref 3.87–5.11)
RDW: 13 % (ref 11.5–15.5)
WBC: 9.1 10*3/uL (ref 4.0–10.5)

## 2014-10-01 LAB — URINALYSIS, ROUTINE W REFLEX MICROSCOPIC
Bilirubin Urine: NEGATIVE
Glucose, UA: >1000 mg/dL — AB
Hgb urine dipstick: NEGATIVE
Ketones, ur: 40 mg/dL — AB
Leukocytes, UA: NEGATIVE
Nitrite: NEGATIVE
Protein, ur: NEGATIVE mg/dL
Specific Gravity, Urine: 1.037 — ABNORMAL HIGH (ref 1.005–1.030)
Urobilinogen, UA: 0.2 mg/dL (ref 0.0–1.0)
pH: 6 (ref 5.0–8.0)

## 2014-10-01 LAB — COMPREHENSIVE METABOLIC PANEL
ALBUMIN: 4 g/dL (ref 3.5–5.0)
ALK PHOS: 73 U/L (ref 38–126)
ALT: 30 U/L (ref 14–54)
AST: 42 U/L — AB (ref 15–41)
Anion gap: 11 (ref 5–15)
BILIRUBIN TOTAL: 1.4 mg/dL — AB (ref 0.3–1.2)
BUN: 15 mg/dL (ref 6–20)
CO2: 22 mmol/L (ref 22–32)
Calcium: 9.3 mg/dL (ref 8.9–10.3)
Chloride: 100 mmol/L — ABNORMAL LOW (ref 101–111)
Creatinine, Ser: 0.62 mg/dL (ref 0.44–1.00)
GFR calc Af Amer: >60 mL/min (ref >60)
GFR calc non Af Amer: >60 mL/min (ref >60)
Glucose, Bld: 383 mg/dL — ABNORMAL HIGH (ref 65–99)
Potassium: 5.5 mmol/L — ABNORMAL HIGH (ref 3.5–5.1)
Sodium: 133 mmol/L — ABNORMAL LOW (ref 135–145)
TOTAL PROTEIN: 7.2 g/dL (ref 6.5–8.1)

## 2014-10-01 LAB — URINE MICROSCOPIC-ADD ON

## 2014-10-01 LAB — LIPASE, BLOOD: Lipase: 30 U/L (ref 22–51)

## 2014-10-01 LAB — PREGNANCY, URINE: Preg Test, Ur: NEGATIVE

## 2014-10-01 MED ORDER — METFORMIN HCL 500 MG PO TABS
500.0000 mg | ORAL_TABLET | Freq: Two times a day (BID) | ORAL | Status: DC
Start: 2014-10-01 — End: 2017-03-27

## 2014-10-01 MED ORDER — ONDANSETRON HCL 4 MG/2ML IJ SOLN
4.0000 mg | Freq: Once | INTRAMUSCULAR | Status: AC
  Administered 2014-10-01: 4 mg via INTRAVENOUS
  Filled 2014-10-01: qty 2

## 2014-10-01 MED ORDER — IBUPROFEN 600 MG PO TABS: 600.0000 mg | ORAL_TABLET | Freq: Four times a day (QID) | ORAL | Status: DC | PRN

## 2014-10-01 MED ORDER — PROMETHAZINE HCL 25 MG/ML IJ SOLN
12.5000 mg | Freq: Once | INTRAMUSCULAR | Status: AC
  Administered 2014-10-01: 12.5 mg via INTRAVENOUS
  Filled 2014-10-01: qty 1

## 2014-10-01 MED ORDER — HYDROCODONE-ACETAMINOPHEN 5-325 MG PO TABS: 1.0000 | ORAL_TABLET | Freq: Four times a day (QID) | ORAL | Status: DC | PRN

## 2014-10-01 MED ORDER — KETOROLAC TROMETHAMINE 30 MG/ML IJ SOLN
30.0000 mg | Freq: Once | INTRAMUSCULAR | Status: AC
  Administered 2014-10-01: 30 mg via INTRAVENOUS
  Filled 2014-10-01: qty 1

## 2014-10-01 MED ORDER — HYDROMORPHONE HCL 1 MG/ML IJ SOLN
1.0000 mg | Freq: Once | INTRAMUSCULAR | Status: AC
  Administered 2014-10-01: 1 mg via INTRAVENOUS
  Filled 2014-10-01: qty 1

## 2014-10-01 MED ORDER — PROMETHAZINE HCL 25 MG PO TABS: 25.0000 mg | ORAL_TABLET | Freq: Three times a day (TID) | ORAL | Status: DC | PRN

## 2014-10-01 MED ORDER — ONDANSETRON HCL 4 MG PO TABS: 4.0000 mg | ORAL_TABLET | Freq: Three times a day (TID) | ORAL | Status: DC | PRN

## 2014-10-01 MED ORDER — SODIUM CHLORIDE 0.9 % IV BOLUS (SEPSIS)
1000.0000 mL | Freq: Once | INTRAVENOUS | Status: AC
  Administered 2014-10-01: 1000 mL via INTRAVENOUS

## 2014-10-01 NOTE — ED Provider Notes (Signed)
CSN: [id number]     Arrival date & time 10/01/14  0911 History   First MD Initiated Contact with Patient 10/01/14 [phone]     Chief Complaint  Patient presents with  . Abdominal Pain  . Back Pain  . Flank Pain     (Consider location/radiation/quality/duration/timing/severity/associated sxs/prior Treatment) HPI Isabella Galloway is a 49 year-old female with pmhx of previous kidney stone who presents to the ER complaining of several hours of R sided flank pain radiating to her R side of her abdomen.  Pt reports going to work this morning with a waxing and waning pain, which gradually progressed into constant pain.  Pt denies having any aggravating or alleviating factors.  She states she is unable to change the pain with change in position, ROM does not affect it.  Pt reports associated nausea, and vomiting.  Pt denies diarrhea, constipation, fever, dysuria.    Past Medical History  Diagnosis Date  . Kidney stone    Past Surgical History  Procedure Laterality Date  . Tailbone fx & subsequent cyst with excision     History reviewed. No pertinent family history. History  Substance Use Topics  . Smoking status: Never Smoker   . Smokeless tobacco: Not on file  . Alcohol Use: No   OB History    No data available     Review of Systems  Constitutional: Negative for fever.  HENT: Negative for trouble swallowing.   Eyes: Negative for visual disturbance.  Respiratory: Negative for shortness of breath.   Cardiovascular: Negative for chest pain.  Gastrointestinal: Positive for nausea, vomiting and abdominal pain.  Genitourinary: Positive for flank pain. Negative for dysuria.  Musculoskeletal: Negative for neck pain.  Skin: Negative for rash.  Neurological: Negative for dizziness, weakness and numbness.  Psychiatric/Behavioral: Negative.       Allergies  Review of patient's allergies indicates no known allergies.  Home Medications   Prior to Admission medications   Medication Sig  Start Date End Date Taking? Authorizing Provider  acetaminophen (TYLENOL) 325 MG tablet Take 650 mg by mouth every 6 (six) hours as needed for mild pain.   Yes Historical Provider, MD  HYDROcodone-acetaminophen (NORCO/VICODIN) 5-325 MG per tablet Take 1-2 tablets by mouth every 6 (six) hours as needed. 10/01/14   Dahlia Bailiff, PA-C  ibuprofen (ADVIL,MOTRIN) 600 MG tablet Take 1 tablet (600 mg total) by mouth every 6 (six) hours as needed. 10/01/14   Dahlia Bailiff, PA-C  metFORMIN (GLUCOPHAGE) 500 MG tablet Take 1 tablet (500 mg total) by mouth 2 (two) times daily with a meal. 10/01/14 10/01/15  Dahlia Bailiff, PA-C  ondansetron (ZOFRAN) 4 MG tablet Take 1 tablet (4 mg total) by mouth every 8 (eight) hours as needed for nausea or vomiting. 10/01/14   Dahlia Bailiff, PA-C  promethazine (PHENERGAN) 25 MG tablet Take 1 tablet (25 mg total) by mouth every 8 (eight) hours as needed for nausea or vomiting. 10/01/14   Dahlia Bailiff, PA-C   BP 139/72 mmHg  Pulse 102  Temp(Src) 99.3 F (37.4 C) (Rectal)  Resp 17  SpO2 96%  LMP 10/16/2011 Physical Exam  Constitutional: She is oriented to person, place, and time. She appears well-developed and well-nourished. No distress.  HENT:  Head: Normocephalic and atraumatic.  Mouth/Throat: Oropharynx is clear and moist. No oropharyngeal exudate.  Eyes: Right eye exhibits no discharge. Left eye exhibits no discharge. No scleral icterus.  Neck: Normal range of motion.  Cardiovascular: Normal rate, regular rhythm and normal heart sounds.  No murmur heard. Pulmonary/Chest: Effort normal and breath sounds normal. No respiratory distress.  Abdominal: Soft. There is tenderness. There is CVA tenderness. There is no rigidity, no guarding, no tenderness at McBurney's point and negative Murphy's sign.    Musculoskeletal: Normal range of motion. She exhibits no edema or tenderness.  Neurological: She is alert and oriented to person, place, and time. She has normal strength. No cranial nerve  deficit or sensory deficit. Coordination normal. GCS eye subscore is 4. GCS verbal subscore is 5. GCS motor subscore is 6.  Patient fully alert, answering questions appropriately in full, clear sentences. Cranial nerves II through XII grossly intact. Motor strength 5 out of 5 in all major muscle groups of upper and lower extremities. Distal sensation intact.   Skin: Skin is warm and dry. No rash noted. She is not diaphoretic.  Psychiatric: She has a normal mood and affect.  Nursing note and vitals reviewed.   ED Course  Procedures (including critical care time) Labs Review Labs Reviewed  CBC WITH DIFFERENTIAL/PLATELET - Abnormal; Notable for the following:    Neutrophils Relative % 80 (*)    All other components within normal limits  COMPREHENSIVE METABOLIC PANEL - Abnormal; Notable for the following:    Sodium 133 (*)    Potassium 5.5 (*)    Chloride 100 (*)    Glucose, Bld 383 (*)    AST 42 (*)    Total Bilirubin 1.4 (*)    All other components within normal limits  URINALYSIS, ROUTINE W REFLEX MICROSCOPIC - Abnormal; Notable for the following:    Specific Gravity, Urine 1.037 (*)    Glucose, UA >1000 (*)    Ketones, ur 40 (*)    All other components within normal limits  LIPASE, BLOOD  PREGNANCY, URINE  URINE MICROSCOPIC-ADD ON    Imaging Review Ct Renal Stone Study  10/01/2014   CLINICAL DATA:  Right flank pain, nausea and vomiting starting this morning, history of kidney stones  EXAM: CT ABDOMEN AND PELVIS WITHOUT CONTRAST  TECHNIQUE: Multidetector CT imaging of the abdomen and pelvis was performed following the standard protocol without IV contrast.  COMPARISON:  10/21/2011  FINDINGS: The lung bases are unremarkable. Mild hepatic fatty infiltration. Sagittal images of the spine are unremarkable. Small hiatal hernia. No intrahepatic biliary ductal dilatation. No calcified gallstones are noted within gallbladder. Unenhanced pancreas, spleen and adrenal glands are unremarkable.  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  No nephrolithiasis. No hydronephrosis or hydroureter. No calcified ureteral calculi are noted bilaterally.  No small bowel obstruction. No ascites or free air. Moderate stool noted in right colon and cecum. No pericecal inflammation. The terminal ileum is unremarkable.  The urinary bladder is unremarkable. No calcified calculi are noted within urinary bladder. There is a hyperdense ovoid lesion within right ovary measures 7.6 x 7 cm. On the prior exam this measures 6 x 6.8 cm. This may represent a hemorrhagic cyst or endometrioma. Less likely solid lesion. Further correlation with pelvic ultrasound and/or MRI is recommended. Again noted hyperdense lesion in left adnexa measures 3.3 cm. This may represent a hemorrhagic cyst. Correlation with pelvic ultrasound is recommended. Normal size uterus.  IMPRESSION: 1. No nephrolithiasis.  No hydronephrosis or hydroureter. 2. No calcified ureteral calculi. 3. No pericecal inflammation. 4. There is a hyperdense ovoid lesion within right ovary measures 7.6 x 7 cm. On the prior exam this measures 6 x 6.8 cm. This may represent a hemorrhagic cyst or endometrioma. Less likely solid lesion.  Further correlation with pelvic ultrasound and/or MRI is recommended. Again noted hyperdense lesion in left adnexa measures 3.3 cm. This may represent a hemorrhagic cyst. Correlation with pelvic ultrasound is recommended. Normal size uterus.   Electronically Signed   By: Lahoma Crocker M.D.   On: 10/01/2014 11:34     EKG Interpretation None      MDM   Final diagnoses:  Right flank pain  Non-intractable vomiting with nausea, vomiting of unspecified type  Hyperglycemia due to type 2 diabetes mellitus    Pt with nonspecific R flank pain.  Symptoms managed effectively in the ER.  On repeat exam there is no concern for acute abdomen and CT findings are nonspecific.  On repeat exam, pt's pain is more reproducible with movement and palpation.   Discussed with pt possibility of MSK back pain, and pt endorses lifting heavy boxes at work over the past 2 days which is unusual for her.  No neurological deficits and normal neuro exam. No loss of bowel or bladder control.  No concern for cauda equina.  No fever, night sweats, weight loss, h/o cancer, IVDU.  RICE protocol and pain medicine indicated and discussed with patient. No concern for cardiac or pulmonary etiology of pt's pain.  Wells Criteria for PE negative. Ovarian lesion again noted on CT which is slightly larger than 3 years ago.  No lower abdominal pain or tenderness, do not believe this is contributing to pt's s/s.  Pt states she did not f/u with OBGYN regarding this, and I strongly recommended f/u with OBGYN.  Will provide referral.  Pt's lab work is unremarkable for acute pathology.  Pt hemodynamically stable and in no acute distress, tolerating PO well.  Patient stable for discharge, we'll provide symptomatic therapy and strongly encouraged follow-up with primary care doctor. Return precautions discussed, patient verbalized understanding and agreement of this plan.  BP 139/72 mmHg  Pulse 102  Temp(Src) 99.3 F (37.4 C) (Rectal)  Resp 17  SpO2 96%  LMP 10/16/2011  Signed,  Dahlia Bailiff, PA-C 7:30 AM  Patient discussed with Dr. Christ Kick, MD   Dahlia Bailiff, PA-C 10/02/14 0730  Daleen Bo, MD 10/02/14 [phone]

## 2014-10-01 NOTE — ED Notes (Signed)
Pt here for right flank pain, back pain that started this am. sts some nausea. Denies urinary complaints.

## 2014-10-01 NOTE — ED Notes (Signed)
Pt stable, ambulatory, friend at bedside, states understanding of discharge instructions

## 2014-10-01 NOTE — Discharge Instructions (Signed)
Flank Pain Flank pain refers to pain that is located on the side of the body between the upper abdomen and the back. The pain may occur over a short period of time (acute) or may be long-term or reoccurring (chronic). It may be mild or severe. Flank pain can be caused by many things. CAUSES  Some of the more common causes of flank pain include:  Muscle strains.   Muscle spasms.   A disease of your spine (vertebral disk disease).   A lung infection (pneumonia).   Fluid around your lungs (pulmonary edema).   A kidney infection.   Kidney stones.   A very painful skin rash caused by the chickenpox virus (shingles).   Gallbladder disease.  Casey care will depend on the cause of your pain. In general,  Rest as directed by your caregiver.  Drink enough fluids to keep your urine clear or pale yellow.  Only take over-the-counter or prescription medicines as directed by your caregiver. Some medicines may help relieve the pain.  Tell your caregiver about any changes in your pain.  Follow up with your caregiver as directed. SEEK IMMEDIATE MEDICAL CARE IF:   Your pain is not controlled with medicine.   You have new or worsening symptoms.  Your pain increases.   You have abdominal pain.   You have shortness of breath.   You have persistent nausea or vomiting.   You have swelling in your abdomen.   You feel faint or pass out.   You have blood in your urine.  You have a fever or persistent symptoms for more than 2-3 days.  You have a fever and your symptoms suddenly get worse. MAKE SURE YOU:   Understand these instructions.  Will watch your condition.  Will get help right away if you are not doing well or get worse. Document Released: 06/21/2005 Document Revised: 01/23/2012 Document Reviewed: 12/13/2011 [facility] Patient Information 2015 [place], [place]. This information is not intended to replace advice given to you by your  health care provider. Make sure you discuss any questions you have with your health care provider.   Abdominal Pain, Women Abdominal (stomach, pelvic, or belly) pain can be caused by many things. It is important to tell your doctor:  The location of the pain.  Does it come and go or is it present all the time?  Are there things that start the pain (eating certain foods, exercise)?  Are there other symptoms associated with the pain (fever, nausea, vomiting, diarrhea)? All of this is helpful to know when trying to find the cause of the pain. CAUSES   Stomach: virus or bacteria infection, or ulcer.  Intestine: appendicitis (inflamed appendix), regional ileitis (Crohn's disease), ulcerative colitis (inflamed colon), irritable bowel syndrome, diverticulitis (inflamed diverticulum of the colon), or cancer of the stomach or intestine.  Gallbladder disease or stones in the gallbladder.  Kidney disease, kidney stones, or infection.  Pancreas infection or cancer.  Fibromyalgia (pain disorder).  Diseases of the female organs:  Uterus: fibroid (non-cancerous) tumors or infection.  Fallopian tubes: infection or tubal pregnancy.  Ovary: cysts or tumors.  Pelvic adhesions (scar tissue).  Endometriosis (uterus lining tissue growing in the pelvis and on the pelvic organs).  Pelvic congestion syndrome (female organs filling up with blood just before the menstrual period).  Pain with the menstrual period.  Pain with ovulation (producing an egg).  Pain with an IUD (intrauterine device, birth control) in the uterus.  Cancer of the  female organs.  Functional pain (pain not caused by a disease, may improve without treatment).  Psychological pain.  Depression. DIAGNOSIS  Your doctor will decide the seriousness of your pain by doing an examination.  Blood tests.  X-rays.  Ultrasound.  CT scan (computed tomography, special type of X-ray).  MRI (magnetic resonance  imaging).  Cultures, for infection.  Barium enema (dye inserted in the large intestine, to better view it with X-rays).  Colonoscopy (looking in intestine with a lighted tube).  Laparoscopy (minor surgery, looking in abdomen with a lighted tube).  Major abdominal exploratory surgery (looking in abdomen with a large incision). TREATMENT  The treatment will depend on the cause of the pain.   Many cases can be observed and treated at home.  Over-the-counter medicines recommended by your caregiver.  Prescription medicine.  Antibiotics, for infection.  Birth control pills, for painful periods or for ovulation pain.  Hormone treatment, for endometriosis.  Nerve blocking injections.  Physical therapy.  Antidepressants.  Counseling with a psychologist or psychiatrist.  Minor or major surgery. HOME CARE INSTRUCTIONS   Do not take laxatives, unless directed by your caregiver.  Take over-the-counter pain medicine only if ordered by your caregiver. Do not take aspirin because it can cause an upset stomach or bleeding.  Try a clear liquid diet (broth or water) as ordered by your caregiver. Slowly move to a bland diet, as tolerated, if the pain is related to the stomach or intestine.  Have a thermometer and take your temperature several times a day, and record it.  Bed rest and sleep, if it helps the pain.  Avoid sexual intercourse, if it causes pain.  Avoid stressful situations.  Keep your follow-up appointments and tests, as your caregiver orders.  If the pain does not go away with medicine or surgery, you may try:  Acupuncture.  Relaxation exercises (yoga, meditation).  Group therapy.  Counseling. SEEK MEDICAL CARE IF:   You notice certain foods cause stomach pain.  Your home care treatment is not helping your pain.  You need stronger pain medicine.  You want your IUD removed.  You feel faint or lightheaded.  You develop nausea and vomiting.  You  develop a rash.  You are having side effects or an allergy to your medicine. SEEK IMMEDIATE MEDICAL CARE IF:   Your pain does not go away or gets worse.  You have a fever.  Your pain is felt only in portions of the abdomen. The right side could possibly be appendicitis. The left lower portion of the abdomen could be colitis or diverticulitis.  You are passing blood in your stools (bright red or black tarry stools, with or without vomiting).  You have blood in your urine.  You develop chills, with or without a fever.  You pass out. MAKE SURE YOU:   Understand these instructions.  Will watch your condition.  Will get help right away if you are not doing well or get worse. Document Released: 02/25/2007 Document Revised: 09/14/2013 Document Reviewed: 03/17/2009 [facility] Patient Information 2015 [place], [place]. This information is not intended to replace advice given to you by your health care provider. Make sure you discuss any questions you have with your health care provider.  Hyperglycemia Hyperglycemia occurs when the glucose (sugar) in your blood is too high. Hyperglycemia can happen for many reasons, but it most often happens to people who do not know they have diabetes or are not managing their diabetes properly.  CAUSES  Whether you have  diabetes or not, there are other causes of hyperglycemia. Hyperglycemia can occur when you have diabetes, but it can also occur in other situations that you might not be as aware of, such as: Diabetes  If you have diabetes and are having problems controlling your blood glucose, hyperglycemia could occur because of some of the following reasons:  Not following your meal plan.  Not taking your diabetes medications or not taking it properly.  Exercising less or doing less activity than you normally do.  Being sick. Pre-diabetes  This cannot be ignored. Before people develop Type 2 diabetes, they almost always have "pre-diabetes." This  is when your blood glucose levels are higher than normal, but not yet high enough to be diagnosed as diabetes. Research has shown that some long-term damage to the body, especially the heart and circulatory system, may already be occurring during pre-diabetes. If you take action to manage your blood glucose when you have pre-diabetes, you may delay or prevent Type 2 diabetes from developing. Stress  If you have diabetes, you may be "diet" controlled or on oral medications or insulin to control your diabetes. However, you may find that your blood glucose is higher than usual in the hospital whether you have diabetes or not. This is often referred to as "stress hyperglycemia." Stress can elevate your blood glucose. This happens because of hormones put out by the body during times of stress. If stress has been the cause of your high blood glucose, it can be followed regularly by your caregiver. That way he/she can make sure your hyperglycemia does not continue to get worse or progress to diabetes. Steroids  Steroids are medications that act on the infection fighting system (immune system) to block inflammation or infection. One side effect can be a rise in blood glucose. Most people can produce enough extra insulin to allow for this rise, but for those who cannot, steroids make blood glucose levels go even higher. It is not unusual for steroid treatments to "uncover" diabetes that is developing. It is not always possible to determine if the hyperglycemia will go away after the steroids are stopped. A special blood test called an A1c is sometimes done to determine if your blood glucose was elevated before the steroids were started. SYMPTOMS  Thirsty.  Frequent urination.  Dry mouth.  Blurred vision.  Tired or fatigue.  Weakness.  Sleepy.  Tingling in feet or leg. DIAGNOSIS  Diagnosis is made by monitoring blood glucose in one or all of the following ways:  A1c test. This is a chemical found in  your blood.  Fingerstick blood glucose monitoring.  Laboratory results. TREATMENT  First, knowing the cause of the hyperglycemia is important before the hyperglycemia can be treated. Treatment may include, but is not be limited to:  Education.  Change or adjustment in medications.  Change or adjustment in meal plan.  Treatment for an illness, infection, etc.  More frequent blood glucose monitoring.  Change in exercise plan.  Decreasing or stopping steroids.  Lifestyle changes. HOME CARE INSTRUCTIONS   Test your blood glucose as directed.  Exercise regularly. Your caregiver will give you instructions about exercise. Pre-diabetes or diabetes which comes on with stress is helped by exercising.  Eat wholesome, balanced meals. Eat often and at regular, fixed times. Your caregiver or nutritionist will give you a meal plan to guide your sugar intake.  Being at an ideal weight is important. If needed, losing as little as 10 to 15 pounds may help improve  blood glucose levels. SEEK MEDICAL CARE IF:   You have questions about medicine, activity, or diet.  You continue to have symptoms (problems such as increased thirst, urination, or weight gain). SEEK IMMEDIATE MEDICAL CARE IF:   You are vomiting or have diarrhea.  Your breath smells fruity.  You are breathing faster or slower.  You are very sleepy or incoherent.  You have numbness, tingling, or pain in your feet or hands.  You have chest pain.  Your symptoms get worse even though you have been following your caregiver's orders.  If you have any other questions or concerns. Document Released: 10/24/2000 Document Revised: 07/23/2011 Document Reviewed: 08/27/2011 [facility] Patient Information 2015 [place], [place]. This information is not intended to replace advice given to you by your health care provider. Make sure you discuss any questions you have with your health care provider.   Emergency Department Resource  Guide 1) Find a Doctor and Pay Out of Pocket Although you won't have to find out who is covered by your insurance plan, it is a good idea to ask around and get recommendations. You will then need to call the office and see if the doctor you have chosen will accept you as a new patient and what types of options they offer for patients who are self-pay. Some doctors offer discounts or will set up payment plans for their patients who do not have insurance, but you will need to ask so you aren't surprised when you get to your appointment.  2) Contact Your Local Health Department Not all health departments have doctors that can see patients for sick visits, but many do, so it is worth a call to see if yours does. If you don't know where your local health department is, you can check in your phone book. The CDC also has a tool to help you locate your state's health department, and many state websites also have listings of all of their local health departments.  3) Find a [facility] If your illness is not likely to be very severe or complicated, you may want to try a walk in clinic. These are popping up all over the country in pharmacies, drugstores, and shopping centers. They're usually staffed by nurse practitioners or physician assistants that have been trained to treat common illnesses and complaints. They're usually fairly quick and inexpensive. However, if you have serious medical issues or chronic medical problems, these are probably not your best option.  No Primary Care Doctor: - Call Health Connect at  [phone] - they can help you locate a primary care doctor that  accepts your insurance, provides certain services, etc. - Physician Referral Service- [phone]  Chronic Pain Problems: Organization         Address  Phone   Notes  [facility]  [phone] Patients need to be referred by their primary care doctor.   Medication Assistance: Organization          Address  Phone   Notes  [facility] Medication [facility] [address] [place], [place] [address] [phone] --Must be a resident of [facility] -- Must have NO insurance coverage whatsoever (no Medicaid/ Medicare, etc.) -- The pt. MUST have a primary care doctor that directs their care regularly and follows them in the community   MedAssist  [phone]   [employer]  [phone]    Agencies that provide inexpensive medical care: Organization  Address  Phone   Notes  [place]  [phone]   [facility]    [phone]   [facility] [place], [place] [address] [phone]   [place] [address] [place] [place] [phone]   Planned Parenthood    [phone]   [facility]    [phone]   [place] and [place] [address], [place] Phone:  [phone], Fax:  [phone] Hours of Operation:  9 am - 6 pm, M-F.  Also accepts Medicaid/Medicare and self-pay.  [facility] for [place], [address], [place] Phone: [phone], Fax: [phone]. Hours of Operation:  8:30 am - 5:30 pm, M-F.  Also accepts Medicaid and self-pay.  [facility] [place] [address] [place] Phone: [phone]   [place], [place], [place] [phone], Ext. [phone] Mondays & Thursdays: 7-9 AM.  First 15 patients are seen on a first come, first serve basis.    [place] Providers:  Organization         Address  Phone   Notes  [facility] [address], [place] [phone] Also accepts self-pay patients.  [facility] [address], [place]  [phone]   [place], [address], [place] [phone]   [facility] [address] [place] [phone]   Lucianne Lei [address], [place]   [phone] Only accepts [place] Access [place] patients after they have their name applied to their card.   Self-Pay (no insurance) in [facility]:  Organization         Address  Phone   Notes  Sickle Cell Patients, [facility] [place] [phone]   [facility] [place] [phone]   [facility] [place]  [place], [place], [place] [phone]   [facility]/Dr. Osei-Bonsu  [address] [place] or [address], [place] [phone] Phone number for both [place] and [place] locations is the same.  [facility] [address] [place] [phone]   [facility] [address] [place] or [address] [phone]   [facility] [address] [place] [phone], phone; [phone], fax Sees patients 1st and 3rd Saturday of every month.  Must not qualify for public or private insurance (i.e. Medicaid, Medicare, [place] Health Choice, Veterans' Benefits)  Household income should be no more than 200% of the poverty level The clinic cannot treat you if you are pregnant or think you are pregnant  Sexually transmitted diseases are not treated at the clinic.    Dental Care: Organization         Address  Phone  Notes  [facility] of [facility] [place] [phone] Accepts children up to age 52 who are enrolled in [place] or [place]; pregnant women with a Medicaid card; and children who have applied for Medicaid or [place] Health Choice, but were declined, whose parents can pay a reduced fee at time of  service.  [facility] of [facility]  [address], [place] [phone] Accepts children up to age 6 who are enrolled in [place] or [place]; pregnant women with a Medicaid card; and children who have applied for Medicaid or [place] Health Choice, but were declined, whose parents can pay a reduced fee at time of service.  [facility] Adult [facility] Access PROGRAM  [place] [phone] Patients are seen by appointment only. Walk-ins are not accepted. [place] will see patients 26 years of age and older. Monday - Tuesday (8am-5pm) Most Wednesdays (8:30-5pm) $30 per visit, cash only  [facility] Adult Dental Access PROGRAM  [address], [facility] [phone] Patients are seen by appointment only. Walk-ins are not accepted. [place] will see patients 72 years of age and older. One Wednesday Evening (Monthly: Volunteer Based).  $30 per visit, cash only  [place]  [phone] for adults; Children under age 9, call Graduate Pediatric Dentistry at [phone]. Children aged 28-14, please call [phone] to request a pediatric application.  Dental services are provided in all areas of dental care including fillings, crowns and bridges, complete and partial dentures, implants, gum treatment, root canals, and extractions. Preventive care is also provided. Treatment is provided to both adults and children. Patients are selected via a lottery and there is often a waiting list.   [facility] [address], [place]  [phone] [email or web address]   [facility] [address] [place], [place] [phone], Ext. [phone] Second and Fourth Thursday of each month, opens at 6:30 AM; Clinic ends at 9 AM.  Patients are seen on a first-come first-served basis, and a limited number are seen during each clinic.   [facility]  [address] Hillard Danker [place], [place] [phone]   Eligibility Requirements You must  have lived in [place], [place], or [place] counties for at least the last three months.   You cannot be eligible for state or federal sponsored [employer], including [employer], [place], or [employer].   You generally cannot be eligible for healthcare insurance through your employer.    How to apply: Eligibility screenings are held every Tuesday and Wednesday afternoon from 1:00 pm until 4:00 pm. You do not need an appointment for the interview!  [facility] [address], [place], [place]   [place]  [place] Department  [place]  [phone]    Behavioral Health Resources in the Community: Intensive Outpatient Programs Organization         Address  Phone  Notes  [place]. [address], [place], [place] [phone]   [facility] [address], [place], [place]   ADS: Alcohol & Drug Svcs [address] [place], [place]   [place] [address],  [place], [place] or [phone]   Substance Abuse Resources Organization         Address  Phone  Notes  Alcohol and Drug Services  8503209753   [place]  [phone]   [place]  [phone]   Chinita Pester  [phone]   Residential & Outpatient Substance Abuse Program  [phone]   Psychological Services Organization         Address  Phone  Notes  [facility]  [place]   [place] [address], [place] or [phone]    Mobile Crisis Teams Organization         Address  Phone  Notes  [facility], Mobile Crisis Care Unit  [phone]   [facility]  [address] [place], [place]   [place] [address], [place] [phone]    Self-Help/Support Groups Organization         Address  Phone             Notes  [place]. of [place] - variety of support groups  [place] Call for more information  Narcotics Anonymous (NA), [facility] [address], [employer] [place]  2 meetings at this location   Special educational needs teacher         Address  Phone  Notes  ASAP Residential Treatment [place],    [place]  [phone]   [facility]  [address], [place] [address], [place], [place]   [place], [place] [phone] Admissions: 8am-3pm M-F  Incentives Substance [place] [address]    [place], [place] [phone]   [facility] [address] [place], [place], [place]   [facility] [address],  [place], [place]   Insight Programs - Intensive Outpatient [address] Kristeen [address], [place], [place]   [facility] ([place] [address].,  [place], [place] [phone] or [phone]   [facility]idential Treatment Services (RTS) [address], [place], [place] Accepts Medicaid  [facility] [place] [address]  [place] [phone] Substance Abuse/Addiction Treatment   [facility] Organization         Address  Phone  Notes  [facility]  [phone]   Domenic Schwab, PhD [address] Arlis Porta [place], [place]   [phone] or [phone]   [place], [place] [phone]   [facility] [address] [place], [place] [phone] Insurance/Medicaid/sponsorship through [facility] and Families [address], [address]                                    [place], [place] [phone] [place] [address], [place] [phone]    Dr. Adele Schilder  [phone]   [facility] [place] Dept. 1) [address], [place] 2) [place] 3)  [place] [address], [place] [phone]  [phone]   Eugenio Saenz [phone] or [phone] (After Hours)

## 2015-08-20 ENCOUNTER — Encounter (HOSPITAL_COMMUNITY): Payer: Self-pay | Admitting: Nurse Practitioner

## 2015-08-20 ENCOUNTER — Emergency Department (HOSPITAL_COMMUNITY)
Admission: EM | Admit: 2015-08-20 | Discharge: 2015-08-20 | Disposition: A | Discharge status: Home / Self Care | Payer: BLUE CROSS/BLUE SHIELD | Attending: Emergency Medicine | Admitting: Emergency Medicine

## 2015-08-20 DIAGNOSIS — Z7984 Long term (current) use of oral hypoglycemic drugs: Secondary | ICD-10-CM | POA: Insufficient documentation

## 2015-08-20 DIAGNOSIS — Z87442 Personal history of urinary calculi: Secondary | ICD-10-CM | POA: Diagnosis not present

## 2015-08-20 DIAGNOSIS — W228XXD Striking against or struck by other objects, subsequent encounter: Secondary | ICD-10-CM | POA: Diagnosis not present

## 2015-08-20 DIAGNOSIS — S0592XD Unspecified injury of left eye and orbit, subsequent encounter: Secondary | ICD-10-CM | POA: Diagnosis present

## 2015-08-20 DIAGNOSIS — Z23 Encounter for immunization: Secondary | ICD-10-CM | POA: Insufficient documentation

## 2015-08-20 DIAGNOSIS — S0502XD Injury of conjunctiva and corneal abrasion without foreign body, left eye, subsequent encounter: Secondary | ICD-10-CM

## 2015-08-20 MED ORDER — TETANUS-DIPHTH-ACELL PERTUSSIS 5-2.5-18.5 LF-MCG/0.5 IM SUSP
0.5000 mL | Freq: Once | INTRAMUSCULAR | Status: AC
  Administered 2015-08-20: 0.5 mL via INTRAMUSCULAR
  Filled 2015-08-20: qty 0.5

## 2015-08-20 MED ORDER — ONDANSETRON 4 MG PO TBDP
4.0000 mg | ORAL_TABLET | Freq: Once | ORAL | Status: AC
  Administered 2015-08-20: 4 mg via ORAL
  Filled 2015-08-20: qty 1

## 2015-08-20 MED ORDER — FLUORESCEIN SODIUM 1 MG OP STRP
1.0000 | ORAL_STRIP | Freq: Once | OPHTHALMIC | Status: AC
  Administered 2015-08-20: 1 via OPHTHALMIC
  Filled 2015-08-20: qty 1

## 2015-08-20 MED ORDER — ONDANSETRON 4 MG PO TBDP: 4.0000 mg | ORAL_TABLET | Freq: Three times a day (TID) | ORAL | Status: DC | PRN

## 2015-08-20 MED ORDER — NAPHAZOLINE-PHENIRAMINE 0.025-0.3 % OP SOLN: 1.0000 [drp] | OPHTHALMIC | Status: DC | PRN

## 2015-08-20 MED ORDER — TETRACAINE HCL 0.5 % OP SOLN
2.0000 [drp] | Freq: Once | OPHTHALMIC | Status: AC
  Administered 2015-08-20: 2 [drp] via OPHTHALMIC
  Filled 2015-08-20: qty 2

## 2015-08-20 NOTE — ED Notes (Signed)
She scratched her L eye this morning with a sheet. [facility] was seen [facility] and was diagnosed with corneal abrasion this afternoon. Since she left [facility] she reports the pain in the eye has gotten worse and it is not relieved by her percocet

## 2015-08-20 NOTE — ED Provider Notes (Signed)
CSN: [id number]     Arrival date & time 08/20/15  1832 History   First MD Initiated Contact with Patient 08/20/15 1848     Chief Complaint  Patient presents with  . Eye Injury     (Consider location/radiation/quality/duration/timing/severity/associated sxs/prior Treatment) HPI   Isabella Galloway is a 50 year old female who presents to the ED today complaining of left eye pain. Patient states this morning her mother had fallen on the ground. She was attempting to catch her and instead grabbed the sheet and when she pulled back on at the sheet scratched her left eye. Patient now has burning in her left eye and states that "it feels like there is a bunch of sand or eyelashes in my eye". Patient was seen at another ER prior to coming to this emergency room. She states that she was diagnosed with corneal abrasion and was given antibiotic eye ointment and Percocet. Patient states that her pain is increased since she left the ER and wants to know if there are eyedrops that she can have to help with her pain. Patient is scheduled to follow up with her ophthalmologist on either Monday or Tuesday. Unknown last tetanus vaccine. Denies fevers, chills, pain with eye movement, redness or swelling around her eye.  Past Medical History  Diagnosis Date  . Kidney stone    Past Surgical History  Procedure Laterality Date  . Tailbone fx & subsequent cyst with excision     History reviewed. No pertinent family history. Social History  Substance Use Topics  . Smoking status: Never Smoker   . Smokeless tobacco: None  . Alcohol Use: No   OB History    No data available     Review of Systems  All other systems reviewed and are negative.     Allergies  Review of patient's allergies indicates no known allergies.  Home Medications   Prior to Admission medications   Medication Sig Start Date End Date Taking? Authorizing Provider  acetaminophen (TYLENOL) 325 MG tablet Take 650 mg by mouth every 6  (six) hours as needed for mild pain.    Historical Provider, MD  HYDROcodone-acetaminophen (NORCO/VICODIN) 5-325 MG per tablet Take 1-2 tablets by mouth every 6 (six) hours as needed. 10/01/14   Dahlia Bailiff, PA-C  ibuprofen (ADVIL,MOTRIN) 600 MG tablet Take 1 tablet (600 mg total) by mouth every 6 (six) hours as needed. 10/01/14   Dahlia Bailiff, PA-C  metFORMIN (GLUCOPHAGE) 500 MG tablet Take 1 tablet (500 mg total) by mouth 2 (two) times daily with a meal. 10/01/14 10/01/15  Dahlia Bailiff, PA-C  ondansetron (ZOFRAN) 4 MG tablet Take 1 tablet (4 mg total) by mouth every 8 (eight) hours as needed for nausea or vomiting. 10/01/14   Dahlia Bailiff, PA-C  promethazine (PHENERGAN) 25 MG tablet Take 1 tablet (25 mg total) by mouth every 8 (eight) hours as needed for nausea or vomiting. 10/01/14   Dahlia Bailiff, PA-C   BP 154/108 mmHg  Pulse 96  Temp(Src) 98 F (36.7 C) (Oral)  Resp 17  SpO2 99%  LMP 10/16/2011 Physical Exam  Constitutional: She is oriented to person, place, and time. She appears well-developed and well-nourished. No distress.  HENT:  Head: Normocephalic and atraumatic.  Eyes: EOM and lids are normal. Pupils are equal, round, and reactive to light. Lids are everted and swept, no foreign bodies found. Right eye exhibits no discharge. Left eye exhibits no discharge. Right conjunctiva is not injected. Right conjunctiva has no hemorrhage. Left conjunctiva is  injected. Left conjunctiva has no hemorrhage. No scleral icterus.  Slit lamp exam:      The left eye shows corneal abrasion and fluorescein uptake. The left eye shows no foreign body, no hyphema and no anterior chamber bulge.  No signs of ruptured globe.  Cardiovascular: Normal rate.   Pulmonary/Chest: Effort normal.  Neurological: She is alert and oriented to person, place, and time. Coordination normal.  Skin: Skin is warm and dry. No rash noted. She is not diaphoretic. No erythema. No pallor.  Psychiatric: She has a normal mood and affect. Her behavior  is normal.  Nursing note and vitals reviewed.   ED Course  Procedures (including critical care time) Labs Review Labs Reviewed - No data to display  Imaging Review No results found. I have personally reviewed and evaluated these images and lab results as part of my medical decision-making.   EKG Interpretation None      MDM   Final diagnoses:  Corneal abrasion, left, subsequent encounter   50 year old female presents to the ED with corneal abrasion that occurred this morning. Patient was seen and evaluated this at another emergency Department prior to arrival. She was given erythromycin ointment and Percocet for pain. Patient also has ophthalmology follow-up in 2 days. However, patient states her pain is not improved and is requesting eyedrops. I repeated a florist seen stain exam today which does reveal left corneal abrasion. Tdap given. Eye irrigated w NS, no evidence of FB.  No change in vision, acuity equal bilaterally.  Pt is not a contact lens wearer.  Exam non-concerning for orbital cellulitis, hyphema, corneal ulcers. Patient is a 23 been given appropriate therapy by previous provider however, will provide additional lubricant eye drops for symptomatic relief.   Patient understands to follow up with ophthalmology, & to return to ER if new symptoms develop including change in vision, purulent drainage, or entrapment.      Dondra Spry [place], PA-C 08/22/15 [id number]  Milton Ferguson, MD 08/23/15 [phone]

## 2015-08-20 NOTE — Discharge Instructions (Signed)
Follow-up with your ophthalmologist as soon as possible for reevaluation. Apply erythromycin ointment to your eye several times daily for lubrication. Use eye drops as needed. Take Percocet as needed for pain. Return to the emergency department if you experience redness or swelling around her eye, pain behind her eye with eye movement, fevers, chills.

## 2016-10-04 ENCOUNTER — Ambulatory Visit (INDEPENDENT_AMBULATORY_CARE_PROVIDER_SITE_OTHER): Payer: BLUE CROSS/BLUE SHIELD | Admitting: Cardiovascular Disease

## 2016-10-04 ENCOUNTER — Encounter: Payer: Self-pay | Admitting: Cardiovascular Disease

## 2016-10-04 VITALS — BP 140/78 | HR 106 | Ht 62.0 in | Wt 195.0 lb

## 2016-10-04 DIAGNOSIS — E785 Hyperlipidemia, unspecified: Secondary | ICD-10-CM

## 2016-10-04 DIAGNOSIS — E669 Obesity, unspecified: Secondary | ICD-10-CM

## 2016-10-04 DIAGNOSIS — I2510 Atherosclerotic heart disease of native coronary artery without angina pectoris: Secondary | ICD-10-CM

## 2016-10-04 DIAGNOSIS — R0602 Shortness of breath: Secondary | ICD-10-CM | POA: Diagnosis not present

## 2016-10-04 DIAGNOSIS — E1169 Type 2 diabetes mellitus with other specified complication: Secondary | ICD-10-CM

## 2016-10-04 DIAGNOSIS — Z79899 Other long term (current) drug therapy: Secondary | ICD-10-CM

## 2016-10-04 MED ORDER — ROSUVASTATIN CALCIUM 10 MG PO TABS: 10.0000 mg | ORAL_TABLET | Freq: Every day | ORAL | 3 refills | Status: DC

## 2016-10-04 NOTE — Patient Instructions (Signed)
Medication Instructions: Dr Sallyanne Kuster has recommended making the following medication changes: 1. START Rosuvastatin 10 mg - take 1 tablet by mouth daily  Labwork: Your physician recommends that you return for lab work in 3 months - FASTING.  Testing/Procedures: 1. Echocardiogram  - Your physician has requested that you have an echocardiogram. Echocardiography is a painless test that uses sound waves to create images of your heart. It provides your doctor with information about the size and shape of your heart and how well your heart's chambers and valves are working. This procedure takes approximately one hour. There are no restrictions for this procedure.  2. Exercise Tolerance Test - Your physician has requested that you have an exercise tolerance test. For further information please visit HugeFiesta.[email or web address]. Please also follow instruction sheet, as given.  These tests have been ordered to be performed at our [facility] location - [address], [address].  Follow-up: Dr Sallyanne Kuster recommends that you schedule a follow-up appointment in 3 months.  If you need a refill on your cardiac medications before your next appointment, please call your pharmacy.

## 2016-10-04 NOTE — Progress Notes (Signed)
Cardiology Consultation Note:    Date:  10/05/2016   ID:  ALLIAH BOULANGER, DOB 07-21-1965, MRN [id number]  PCP:  Gennette Pac, NP  Cardiologist:  Sanda Klein, MD    Referring MD: Gennette Pac, NP   Chief Complaint  Patient presents with  . Follow-up    New patient.  . Edema    Legs.   Isabella Galloway is a 51 y.o. female who is being seen today for the evaluation of Her artery calcification seen on CT scan, at the request of Carlis Abbott, Felipa Evener, NP.   History of Present Illness:    Isabella Galloway is a 51 y.o. female with a hx of Hyperlipidemia and mild diabetes mellitus, referred for evaluation of coronary calcification seen on CT scan: showed extensive calcification in the LAD artery.  I have known Isabella Galloway for a few years since she would always accompany her mother Isabella Galloway to office appointments. Her mother recently passed away due to severe pulmonary and cardiac problems.  Isabella Galloway has really not been taking care of herself, since she was so preoccupied with her mother's illnesses. She reports that for at least a year now she has had shortness of breath climbing a flight of stairs at work. She develops chest pain when she is emotional, for example when her mother was ill, but does not have chest pain usually with physical activity. More recently she has developed a lot of lower extremity edema. This is not particular prominent today, but sometimes her shoes become very uncomfortable due to swelling. She has to wear steel toed boots at work. 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 The swelling was prominent enough that Dr. Carlis Abbott ordered lower extremity venous Dopplers. This did not show evidence of DVT or venous reflux.  Recent laboratory tests show that she has severe uncontrolled diabetes mellitus with a hemoglobin A1c of 13.9%. She also has mixed hyperlipidemia with severely elevated LDL  cholesterol. Total cholesterol 274, LDL 197, HDL 55. He has normal renal parameters with a creatinine of 0.5. The fasting glucose was 284.  Past Medical History:  Diagnosis Date  . Kidney stone     Past Surgical History:  Procedure Laterality Date  . PILONIDAL CYST DRAINAGE  1980  . tailbone fx & subsequent cyst with excision      Current Medications: Current Meds  Medication Sig  . clonazePAM (KLONOPIN) 0.5 MG tablet Take 0.5 mg by mouth 2 (two) times daily.  . cyclobenzaprine (FLEXERIL) 5 MG tablet Take 5-10 mg by mouth 3 (three) times daily as needed for muscle spasms.  Marland Kitchen escitalopram (LEXAPRO) 10 MG tablet Take 10 mg by mouth daily.  . meloxicam (MOBIC) 15 MG tablet Take 15 mg by mouth daily.  Marland Kitchen omeprazole (PRILOSEC) 40 MG capsule Take 40 mg by mouth daily.  . traZODone (DESYREL) 50 MG tablet Take 50 mg by mouth at bedtime.     Allergies:   Patient has no known allergies.   Social History   Social History  . Marital status: Single    Spouse name: N/A  . Number of children: N/A  . Years of education: N/A   Social History Main Topics  . Smoking status: Never Smoker  . Smokeless tobacco: Never Used  . Alcohol use No  . Drug use: No  . Sexual activity: Not Asked   Other Topics Concern  . None   Social History Narrative  .  None     Family History: The patient's family history includes COPD in her mother; Cancer in her maternal grandmother and mother; Heart attack in her maternal grandfather; Heart failure in her mother. ROS:   Please see the history of present illness.     All other systems reviewed and are negative.  EKGs/Labs/Other Studies Reviewed:    The following studies were reviewed today: CT chest, venous ultrasound, labs and notes in care everywhere  EKG:  EKG is  ordered today.  The ekg ordered today demonstrates sinus tachycardia, mild poor R-wave progression with transition in V4, normal repolarization, QTC 454 ms  Recent Labs: Labs  09/07/2016 hemoglobin A1c 13.9%, Total cholesterol 274, LDL 197, HDL 55, TG 108. She has normal renal parameters with a creatinine of 0.5. The fasting glucose was 284.  Physical Exam:    VS:  BP 140/78   Pulse (!) 106   Ht 5\' 2"  (1.575 m)   Wt 195 lb (88.5 kg)   LMP 10/16/2011 (Exact Date)   BMI 35.67 kg/m     Wt Readings from Last 3 Encounters:  10/04/16 195 lb (88.5 kg)     GEN: Moderately obese, Well nourished, well developed in no acute distress HEENT: Normal NECK: No JVD; No carotid bruits LYMPHATICS: No lymphadenopathy CARDIAC: RRR, no murmurs, rubs, gallops RESPIRATORY:  Clear to auscultation without rales, wheezing or rhonchi  ABDOMEN: Soft, non-tender, non-distended MUSCULOSKELETAL:  Mild ankle swelling, a little more obvious on the left (where there is some pitting) rather than the right; No deformity  SKIN: Warm and dry NEUROLOGIC:  Alert and oriented x 3 PSYCHIATRIC:  Normal affect   ASSESSMENT:    1. Shortness of breath   2. Dyslipidemia   3. Medication management    PLAN:    In order of problems listed above:  1. Possible CHF: [name] is describing exertional dyspnea NYHA functional class II and has tachycardia at rest. I'm concerned that she may have congestive heart failure and have ordered an echocardiogram. Her blood pressure is not particularly high. No overt findings of hypervolemia on physical exam. If left ventricular systolic function is clearly abnormal or if there are regional wall motion abnormalities, proceed directly to coronary angiography. 2. Cor calcification: Her symptoms are mostly atypical, although they have some features suggestive of coronary insufficiency. There are no acute repolarization abnormalities on her ECG. Have recommended a treadmill stress test. This is abnormal, I would recommend proceeding directly to coronary angiography. Recommend aspirin 81 mg daily. She has numerous poorly controlled risk factors for heart disease. 3. HLP:  Even if her glycemic control were to improve, it is likely that she would still have severe hypercholesterolemia. 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 I have recommended that she start statins. We need to achieve at least a 50% reduction in her LDL cholesterol level and will have to use a potent agent such as atorvastatin or rosuvastatin, not withstanding the concern surrounding hyperglycemia with these agents. Recheck lipids in about 3 months. 4. DM: Very poorly controlled. She is just starting treatment with antidiabetic agents. 5. Tachycardia: At rest, raising concern for heart failure. Not overtly hypervolemic on exam, except for very mild pitting edema on the left ankle. Might also be tachycardic for the opposite reason, hypovolemia related to obliterate glucosuria with poorly controlled diabetes. Echo will help assess volume status   Medication Adjustments/Labs and Tests Ordered: Current medicines are reviewed at  length with the patient today.  Concerns regarding medicines are outlined above. Labs and tests ordered and medication changes are outlined in the patient instructions below:  Patient Instructions  Medication Instructions: Dr Sallyanne Kuster has recommended making the following medication changes: 1. START Rosuvastatin 10 mg - take 1 tablet by mouth daily  Labwork: Your physician recommends that you return for lab work in 3 months - FASTING.  Testing/Procedures: 1. Echocardiogram  - Your physician has requested that you have an echocardiogram. Echocardiography is a painless test that uses sound waves to create images of your heart. It provides your doctor with information about the size and shape of your heart and how well your heart's chambers and valves are working. This procedure takes approximately one hour. There are no restrictions for this procedure.  2. Exercise Tolerance Test - Your  physician has requested that you have an exercise tolerance test. For further information please visit HugeFiesta.[email or web address]. Please also follow instruction sheet, as given.  These tests have been ordered to be performed at our [facility] location - [address], [address].  Follow-up: Dr Sallyanne Kuster recommends that you schedule a follow-up appointment in 3 months.  If you need a refill on your cardiac medications before your next appointment, please call your pharmacy.    Signed, Sanda Klein, MD  10/05/2016 12:10 PM    [facility]

## 2016-10-05 DIAGNOSIS — E669 Obesity, unspecified: Secondary | ICD-10-CM

## 2016-10-05 DIAGNOSIS — E785 Hyperlipidemia, unspecified: Secondary | ICD-10-CM | POA: Insufficient documentation

## 2016-10-05 DIAGNOSIS — E1169 Type 2 diabetes mellitus with other specified complication: Secondary | ICD-10-CM | POA: Insufficient documentation

## 2016-10-05 DIAGNOSIS — I2510 Atherosclerotic heart disease of native coronary artery without angina pectoris: Secondary | ICD-10-CM | POA: Insufficient documentation

## 2016-10-17 ENCOUNTER — Telehealth (HOSPITAL_COMMUNITY): Payer: Self-pay | Admitting: Cardiovascular Disease

## 2016-10-17 NOTE — Telephone Encounter (Signed)
I contacted the patient to move her appt time from 3:30pm to 11:30am due to the department undergoing construction at that time. She voiced understanding.

## 2016-10-24 ENCOUNTER — Telehealth (HOSPITAL_COMMUNITY): Payer: Self-pay

## 2016-10-24 NOTE — Telephone Encounter (Signed)
Encounter complete. 

## 2016-10-26 ENCOUNTER — Other Ambulatory Visit: Payer: Self-pay

## 2016-10-26 ENCOUNTER — Ambulatory Visit (HOSPITAL_COMMUNITY)
Admission: RE | Admit: 2016-10-26 | Discharge: 2016-10-26 | Disposition: A | Discharge status: Home / Self Care | Payer: BLUE CROSS/BLUE SHIELD | Source: Ambulatory Visit | Attending: Cardiovascular Disease | Admitting: Cardiovascular Disease

## 2016-10-26 ENCOUNTER — Ambulatory Visit (HOSPITAL_BASED_OUTPATIENT_CLINIC_OR_DEPARTMENT_OTHER): Payer: BLUE CROSS/BLUE SHIELD

## 2016-10-26 ENCOUNTER — Other Ambulatory Visit (HOSPITAL_COMMUNITY): Payer: BLUE CROSS/BLUE SHIELD

## 2016-10-26 DIAGNOSIS — R0602 Shortness of breath: Secondary | ICD-10-CM

## 2016-10-26 LAB — EXERCISE TOLERANCE TEST
CSEPEDS: 0 s
CSEPEW: 7 METS
CSEPPHR: 133 {beats}/min
Exercise duration (min): 6 min
MPHR: 168 {beats}/min
Percent HR: 79 %
RPE: 18
Rest HR: 86 {beats}/min

## 2016-11-02 ENCOUNTER — Encounter: Payer: Self-pay | Admitting: Cardiovascular Disease

## 2016-11-08 ENCOUNTER — Other Ambulatory Visit: Payer: Self-pay | Admitting: Family

## 2016-11-08 DIAGNOSIS — R921 Mammographic calcification found on diagnostic imaging of breast: Secondary | ICD-10-CM

## 2016-11-09 ENCOUNTER — Other Ambulatory Visit: Payer: Self-pay | Admitting: Family

## 2016-11-09 DIAGNOSIS — R921 Mammographic calcification found on diagnostic imaging of breast: Secondary | ICD-10-CM

## 2016-11-15 ENCOUNTER — Telehealth: Payer: Self-pay | Admitting: Cardiovascular Disease

## 2016-11-15 NOTE — Telephone Encounter (Signed)
Received records from [facility] for appointment on 01/04/17 with Dr Sallyanne Kuster.  Records put with Dr Crouitoru's schedule for 01/04/17. lp

## 2016-11-16 ENCOUNTER — Ambulatory Visit
Admission: RE | Admit: 2016-11-16 | Discharge: 2016-11-16 | Disposition: A | Discharge status: Home / Self Care | Payer: BLUE CROSS/BLUE SHIELD | Source: Ambulatory Visit | Attending: Family | Admitting: Family

## 2016-11-16 DIAGNOSIS — R921 Mammographic calcification found on diagnostic imaging of breast: Secondary | ICD-10-CM

## 2016-11-22 ENCOUNTER — Telehealth: Payer: Self-pay

## 2016-11-22 ENCOUNTER — Telehealth (HOSPITAL_COMMUNITY): Payer: Self-pay | Admitting: Cardiovascular Disease

## 2016-11-22 DIAGNOSIS — R0602 Shortness of breath: Secondary | ICD-10-CM

## 2016-11-22 DIAGNOSIS — I2510 Atherosclerotic heart disease of native coronary artery without angina pectoris: Secondary | ICD-10-CM

## 2016-11-22 NOTE — Telephone Encounter (Signed)
Croitoru, Dani Gobble, MD  Truitt, Dionne Bucy, CMA        Leane Call, please  MCr    See patient email for further details/history.

## 2016-11-22 NOTE — Telephone Encounter (Signed)
Lexiscan ordered.  Forwarded to [employer] for scheduling.

## 2016-11-26 NOTE — Telephone Encounter (Signed)
Called pt and lmsg for her to CB to get scheduled for myoview.

## 2016-11-30 ENCOUNTER — Telehealth (HOSPITAL_COMMUNITY): Payer: Self-pay

## 2016-11-30 NOTE — Telephone Encounter (Signed)
Encounter complete. 

## 2016-12-05 ENCOUNTER — Ambulatory Visit (HOSPITAL_COMMUNITY)
Admission: RE | Admit: 2016-12-05 | Discharge: 2016-12-05 | Disposition: A | Discharge status: Home / Self Care | Payer: BLUE CROSS/BLUE SHIELD | Source: Ambulatory Visit | Attending: Cardiovascular Disease | Admitting: Cardiovascular Disease

## 2016-12-05 ENCOUNTER — Encounter (HOSPITAL_COMMUNITY): Payer: BLUE CROSS/BLUE SHIELD

## 2016-12-05 DIAGNOSIS — R0602 Shortness of breath: Secondary | ICD-10-CM | POA: Diagnosis not present

## 2016-12-05 DIAGNOSIS — I2510 Atherosclerotic heart disease of native coronary artery without angina pectoris: Secondary | ICD-10-CM | POA: Diagnosis present

## 2016-12-05 LAB — MYOCARDIAL PERFUSION IMAGING
CHL CUP NUCLEAR SSS: 1
CSEPPHR: 109 {beats}/min
LVDIAVOL: 66 mL (ref 46–106)
LVSYSVOL: 23 mL
Rest HR: 90 {beats}/min
SDS: 1
SRS: 0
TID: 1.07

## 2016-12-05 MED ORDER — TECHNETIUM TC 99M TETROFOSMIN IV KIT
31.0000 | PACK | Freq: Once | INTRAVENOUS | Status: AC | PRN
  Administered 2016-12-05: 31 via INTRAVENOUS
  Filled 2016-12-05: qty 31

## 2016-12-05 MED ORDER — TECHNETIUM TC 99M TETROFOSMIN IV KIT
10.9000 | PACK | Freq: Once | INTRAVENOUS | Status: AC | PRN
  Administered 2016-12-05: 10.9 via INTRAVENOUS
  Filled 2016-12-05: qty 11

## 2016-12-05 MED ORDER — REGADENOSON 0.4 MG/5ML IV SOLN
0.4000 mg | Freq: Once | INTRAVENOUS | Status: AC
  Administered 2016-12-05: 0.4 mg via INTRAVENOUS

## 2016-12-10 ENCOUNTER — Ambulatory Visit: Payer: BLUE CROSS/BLUE SHIELD | Admitting: Cardiovascular Disease

## 2016-12-10 ENCOUNTER — Encounter: Payer: Self-pay | Admitting: Cardiovascular Disease

## 2016-12-13 ENCOUNTER — Encounter: Payer: Self-pay | Admitting: Cardiovascular Disease

## 2017-01-04 ENCOUNTER — Ambulatory Visit: Payer: BLUE CROSS/BLUE SHIELD | Admitting: Cardiovascular Disease

## 2017-02-20 NOTE — Patient Instructions (Addendum)
Your procedure is scheduled on: Wednesday March 06, 2017 at 11:48   Enter through the Main Entrance of [facility] at:10:15 am  Pick up the phone at the desk and dial [phone].  Call this number if you have problems the morning of surgery: [phone].  Remember: Do NOT eat food or drink any liquids: after Midnight on Tuesday October 23  Take these medicines the morning of surgery with a SIP OF WATER: CRESTOR, LEXAPRO,  MOBIC  HOLD METFORMIN FOR 24 HOURS PRIOR TO SURGERY  STOP ALL VITAMINS AND SUPPLEMENTS 1 WEEK PRIOR TO SURGERY  DO NOT SMOKE DAY OF SURGERY  Do NOT wear jewelry (body piercing), metal hair clips/bobby pins, make-up, or nail polish. Do NOT wear lotions, powders, or perfumes.  You may wear deoderant. Do NOT shave for 48 hours prior to surgery. Do NOT bring valuables to the hospital. Contacts, dentures, or bridgework may not be worn into surgery.  Have a responsible adult drive you home and stay with you for 24 hours

## 2017-02-23 NOTE — H&P (Signed)
Isabella Galloway is an 51 y.o. female, presenting for scheduled laparoscopic bilateral salpingo-oophorectomy due to adnexal mass.  While this has been known about for several years, it has only been recently that she has noted worsening pelvic and back pain.  She is concerned that the ovarian cyst may be contributing to her pain and has decided to proceed with surgical recommendations.  In review, the cyst was first discovered in 2013 where an US showed:  The right ovary measures 7.4 x 6.4 x 9.4 cm. Within the right ovary is a hypoechoic avascular mildly heterogeneous lesion which measures 6.5 x 5.5 x 6.1 cm. The left ovary measures 5.7 x 3.4 x 3.5 cm. Within the left ovary is a 2.3 cm area of hypoechogenicity. No significant free fluid.  At that time, the patient did not seek further management due to anxiety about health. She presented to my office in July to reassess this known mass. An [place] was performed in our office that showed: [place] completed 12/06/16: 7cm anteverted uterus with 2 small fibroids (<2cm). Right complex ovarian cyst with thin septations 4.6x3.5x3.4cm-avascular stable in size. Left ovary with a 2.4cm cyst and a 1.3cm complex cyst with low level echos- both avascular. No free fluid.  ROMA: low risk  After much discussion, the patient desires to proceed with surgical intervention.     Current Medication:  Taking  Metformin HCl 1000 MG Tablet 2 Orally Twice a day     Bydureon(Exenatide ER) 2 MG Pen-injector Subcutaneous     Gabapentin 100 MG Capsule 1 capsule Orally Three times a day     Meloxicam 15 MG Tablet 1 tablet Orally Once a day     Rosuvastatin Calcium 10 MG Tablet 1 tablet Orally Once a day     Omeprazole 40 MG Capsule Delayed Release 1 tablet Orally Once a day     Escitalopram Oxalate 10 MG Tablet 1 tablet Orally Once a day     Tylenol(APAP) 325 MG Tablet 1 tablet as needed Orally every 4 hrs     Melatonin 3 MG Tablet 1 tablet at bedtime as needed with food  Orally Once a day     Diazepam 5 MG Tablet 1 tablet as needed Orally prior to appointment     Medication List reviewed and reconciled with the patient   Medical History:   Diabetes Mellitus     Anxiety     esophagitis     cataracts     hypertension     Lt eye corneal abrasion     Eye trauma     Disease of thyroid gland      Allergies/Intolerance:   N.K.D.A.   Gyn History:   Sexual activity not currently sexually active.  Periods : postmenopausal.  Denies LMP.  Denies Birth control.  Last pap smear date 11/22/2016 Negative.  Last mammogram date 10/2016.  Denies Abnormal pap smear.  Denies STD.   OB History:   Never been pregnant per patient.   Surgical History:   Cyst on the tailbone 1999     Cystotomy     EGD 01/2017     Colonoscopy 01/2017   Hospitalization:   Not in the past yr. 01/2017   Family History:   Father: deceased    Mother: deceased, diabetes insipidus, COPD, CHF, aortic anuerysm     Paternal [place] Mother: deceased, Heart attack     Maternal Grand Father: deceased, diagnosed with Breast cancer    Maternal uncle: deceased, Heart attack , diagnosed  with Diabetes   Mother had colon polyps, and cirrhosis of the Liver.  Social History:  General Tobacco use  cigarettes: Never smoked  Tobacco history last updated 02/27/2017  Alcohol: yes, social, , liquor.  no Recreational drug use.  Marital Status: single.  OCCUPATION: [employer] .    ROS  Review of Systems  CONSTITUTIONAL:  Chills yes. no Fever. Night sweats yes.  HEENT:  no Double vision. sore throat no.  CARDIOLOGY:  no Chest pain.  RESPIRATORY:  no Shortness of breath. no Cough.  UROLOGY:  no Urinary frequency. no Urinary incontinence. no Urinary urgency.  GASTROENTEROLOGY:  Abdominal pain yes. Appetite change yes. Bloating/belching yes. no Change in bowel movements. Diarrhea yes. Nausea yes.  FEMALE REPRODUCTIVE:  no Breast lumps or discharge. no Breast pain.  NEUROLOGY:  no  Dizziness. Headache yes. no Loss of consciousness.  PSYCHOLOGY:  no Anxiety. no Depression.  SKIN:  no Rash. no Hives.  HEMATOLOGY/LYMPH:  no Anemia. Fatigue yes. Using Blood Thinners no.     Vital Signs  Wt 189, Wt change .2 lb, Ht 62, BMI 34.56, Pulse sitting 101, BP sitting 128/88.   Examination  General Examination: GENERAL APPEARANCE well developed, well nourished .  SKIN: warm and dry, no rashes .  NECK: supple, normal appearance .  LUNGS: clear to auscultation bilaterally, no wheezes, rhonchi, rales.  HEART: no murmurs, regular rate and rhythm.  ABDOMEN: obese, soft, non-tender, no rebound, no guarding.  MUSCULOSKELETAL no calf tenderness bilaterally .  EXTREMITIES: no edema present .  NEUROLOGIC EXAM: alert and oriented x 3.  PSYCH: appropriate mood and affect .      Last menstrual period 10/16/2011. Physical Exam  CBC    Component Value Date/Time   WBC 7.4 02/27/2017 1020   RBC 4.54 02/27/2017 1020   HGB 13.9 02/27/2017 1020   HCT 41.0 02/27/2017 1020   PLT 253 02/27/2017 1020   MCV 90.3 02/27/2017 1020   MCH 30.6 02/27/2017 1020   MCHC 33.9 02/27/2017 1020   RDW 13.5 02/27/2017 1020   LYMPHSABS 1.6 10/01/2014 0934   MONOABS 0.3 10/01/2014 0934   EOSABS 0.0 10/01/2014 0934   BASOSABS 0.0 10/01/2014 0934    A/P 51yo PM female who presents for Laparoscopic bilateral salpingo-oophorectomy due to complex ovarian cyst and pelvic pain -NPO -LR @ 125cc/hr -SCDs to OR -Risk/benefit and alternatives reviewed with patient including but not limited to risk of bleeding, infection and injury to surrounding organs.  Questions and concerns were addressed and pt wishes to proceed with procedure  Janyth Pupa, DO [phone] (cell) [phone] (office)

## 2017-02-27 ENCOUNTER — Encounter (HOSPITAL_COMMUNITY)
Admission: RE | Admit: 2017-02-27 | Discharge: 2017-02-27 | Disposition: A | Discharge status: Home / Self Care | Payer: BLUE CROSS/BLUE SHIELD | Source: Ambulatory Visit | Attending: Obstetrics & Gynecology | Admitting: Obstetrics & Gynecology

## 2017-02-27 ENCOUNTER — Encounter (HOSPITAL_COMMUNITY): Payer: Self-pay

## 2017-02-27 DIAGNOSIS — Z01818 Encounter for other preprocedural examination: Secondary | ICD-10-CM | POA: Insufficient documentation

## 2017-02-27 DIAGNOSIS — N83291 Other ovarian cyst, right side: Secondary | ICD-10-CM | POA: Insufficient documentation

## 2017-02-27 HISTORY — DX: Type 2 diabetes mellitus without complications: E11.9

## 2017-02-27 HISTORY — DX: Depression, unspecified: F32.A

## 2017-02-27 HISTORY — DX: Peripheral vascular disease, unspecified: I73.9

## 2017-02-27 HISTORY — DX: Encounter for screening for cardiovascular disorders: Z13.6

## 2017-02-27 HISTORY — DX: Major depressive disorder, single episode, unspecified: F32.9

## 2017-02-27 HISTORY — DX: Anxiety disorder, unspecified: F41.9

## 2017-02-27 LAB — COMPREHENSIVE METABOLIC PANEL
ALK PHOS: 63 U/L (ref 38–126)
ALT: 20 U/L (ref 14–54)
AST: 18 U/L (ref 15–41)
Albumin: 3.9 g/dL (ref 3.5–5.0)
Anion gap: 6 (ref 5–15)
BILIRUBIN TOTAL: 0.4 mg/dL (ref 0.3–1.2)
BUN: 17 mg/dL (ref 6–20)
CALCIUM: 9.1 mg/dL (ref 8.9–10.3)
CO2: 29 mmol/L (ref 22–32)
Chloride: 104 mmol/L (ref 101–111)
Creatinine, Ser: 0.53 mg/dL (ref 0.44–1.00)
GFR calc Af Amer: >60 mL/min (ref >60)
GLUCOSE: 142 mg/dL — AB (ref 65–99)
POTASSIUM: 4.5 mmol/L (ref 3.5–5.1)
Sodium: 139 mmol/L (ref 135–145)
TOTAL PROTEIN: 7.6 g/dL (ref 6.5–8.1)

## 2017-02-27 LAB — TYPE AND SCREEN
ABO/RH(D): AB POS
Antibody Screen: NEGATIVE

## 2017-02-27 LAB — CBC
HCT: 41 % (ref 36.0–46.0)
Hemoglobin: 13.9 g/dL (ref 12.0–15.0)
MCH: 30.6 pg (ref 26.0–34.0)
MCHC: 33.9 g/dL (ref 30.0–36.0)
MCV: 90.3 fL (ref 78.0–100.0)
Platelets: 253 10*3/uL (ref 150–400)
RBC: 4.54 MIL/uL (ref 3.87–5.11)
RDW: 13.5 % (ref 11.5–15.5)
WBC: 7.4 10*3/uL (ref 4.0–10.5)

## 2017-02-27 LAB — ABO/RH: ABO/RH(D): AB POS

## 2017-03-06 ENCOUNTER — Encounter (HOSPITAL_COMMUNITY): Payer: Self-pay | Admitting: Anesthesiology

## 2017-03-06 ENCOUNTER — Ambulatory Visit (HOSPITAL_COMMUNITY): Payer: BLUE CROSS/BLUE SHIELD | Admitting: Anesthesiology

## 2017-03-06 ENCOUNTER — Ambulatory Visit (HOSPITAL_COMMUNITY)
Admission: AD | Admit: 2017-03-06 | Discharge: 2017-03-06 | Disposition: A | Discharge status: Home / Self Care | Payer: BLUE CROSS/BLUE SHIELD | Source: Ambulatory Visit | Attending: Obstetrics & Gynecology | Admitting: Obstetrics & Gynecology

## 2017-03-06 ENCOUNTER — Encounter (HOSPITAL_COMMUNITY)
Admission: AD | Disposition: A | Discharge status: Home / Self Care | Payer: Self-pay | Source: Ambulatory Visit | Attending: Obstetrics & Gynecology

## 2017-03-06 DIAGNOSIS — Z7984 Long term (current) use of oral hypoglycemic drugs: Secondary | ICD-10-CM | POA: Insufficient documentation

## 2017-03-06 DIAGNOSIS — I10 Essential (primary) hypertension: Secondary | ICD-10-CM | POA: Diagnosis not present

## 2017-03-06 DIAGNOSIS — F419 Anxiety disorder, unspecified: Secondary | ICD-10-CM | POA: Insufficient documentation

## 2017-03-06 DIAGNOSIS — C5702 Malignant neoplasm of left fallopian tube: Secondary | ICD-10-CM | POA: Diagnosis present

## 2017-03-06 DIAGNOSIS — D271 Benign neoplasm of left ovary: Secondary | ICD-10-CM | POA: Diagnosis not present

## 2017-03-06 DIAGNOSIS — I2510 Atherosclerotic heart disease of native coronary artery without angina pectoris: Secondary | ICD-10-CM | POA: Diagnosis not present

## 2017-03-06 DIAGNOSIS — E119 Type 2 diabetes mellitus without complications: Secondary | ICD-10-CM | POA: Insufficient documentation

## 2017-03-06 DIAGNOSIS — N736 Female pelvic peritoneal adhesions (postinfective): Secondary | ICD-10-CM | POA: Diagnosis not present

## 2017-03-06 DIAGNOSIS — Z79899 Other long term (current) drug therapy: Secondary | ICD-10-CM | POA: Insufficient documentation

## 2017-03-06 HISTORY — PX: LAPAROSCOPIC BILATERAL SALPINGO OOPHERECTOMY: SHX5890

## 2017-03-06 HISTORY — DX: Gastro-esophageal reflux disease without esophagitis: K21.9

## 2017-03-06 HISTORY — DX: Sleep apnea, unspecified: G47.30

## 2017-03-06 HISTORY — DX: Hyperlipidemia, unspecified: E78.5

## 2017-03-06 HISTORY — DX: Personal history of urinary calculi: Z87.442

## 2017-03-06 LAB — GLUCOSE, CAPILLARY: GLUCOSE-CAPILLARY: 186 mg/dL — AB (ref 65–99)

## 2017-03-06 SURGERY — SALPINGO-OOPHORECTOMY, BILATERAL, LAPAROSCOPIC
Anesthesia: General | Site: Abdomen | Laterality: Bilateral

## 2017-03-06 MED ORDER — MIDAZOLAM HCL 2 MG/2ML IJ SOLN
INTRAMUSCULAR | Status: AC
  Filled 2017-03-06: qty 2

## 2017-03-06 MED ORDER — LACTATED RINGERS IV SOLN
INTRAVENOUS | Status: DC
  Administered 2017-03-06 (×2): via INTRAVENOUS

## 2017-03-06 MED ORDER — ROCURONIUM BROMIDE 100 MG/10ML IV SOLN
INTRAVENOUS | Status: AC
  Filled 2017-03-06: qty 1

## 2017-03-06 MED ORDER — SUGAMMADEX SODIUM 200 MG/2ML IV SOLN
INTRAVENOUS | Status: DC | PRN
  Administered 2017-03-06: 200 mg via INTRAVENOUS

## 2017-03-06 MED ORDER — ACETAMINOPHEN 10 MG/ML IV SOLN
INTRAVENOUS | Status: AC
  Filled 2017-03-06: qty 100

## 2017-03-06 MED ORDER — ONDANSETRON HCL 4 MG/2ML IJ SOLN
INTRAMUSCULAR | Status: AC
  Filled 2017-03-06: qty 2

## 2017-03-06 MED ORDER — HYDROMORPHONE HCL 1 MG/ML IJ SOLN
0.2500 mg | INTRAMUSCULAR | Status: DC | PRN
Start: 2017-03-06 — End: 2017-03-06

## 2017-03-06 MED ORDER — IBUPROFEN 600 MG PO TABS: 600.0000 mg | ORAL_TABLET | Freq: Four times a day (QID) | ORAL | 0 refills | Status: DC | PRN

## 2017-03-06 MED ORDER — LIDOCAINE HCL (CARDIAC) 20 MG/ML IV SOLN
INTRAVENOUS | Status: DC | PRN
  Administered 2017-03-06: 60 mg via INTRAVENOUS

## 2017-03-06 MED ORDER — PROPOFOL 10 MG/ML IV BOLUS
INTRAVENOUS | Status: AC
  Filled 2017-03-06: qty 20

## 2017-03-06 MED ORDER — PHENYLEPHRINE HCL 10 MG/ML IJ SOLN
INTRAMUSCULAR | Status: DC | PRN
  Administered 2017-03-06: .06 mg via INTRAVENOUS

## 2017-03-06 MED ORDER — DOCUSATE SODIUM 100 MG PO CAPS: 100.0000 mg | ORAL_CAPSULE | Freq: Two times a day (BID) | ORAL | 0 refills | Status: AC

## 2017-03-06 MED ORDER — SODIUM CHLORIDE 0.9 % IJ SOLN
INTRAMUSCULAR | Status: AC
  Filled 2017-03-06: qty 10

## 2017-03-06 MED ORDER — SCOPOLAMINE 1 MG/3DAYS TD PT72
MEDICATED_PATCH | TRANSDERMAL | Status: AC
  Filled 2017-03-06: qty 1

## 2017-03-06 MED ORDER — BUPIVACAINE HCL (PF) 0.25 % IJ SOLN
INTRAMUSCULAR | Status: AC
  Filled 2017-03-06: qty 30

## 2017-03-06 MED ORDER — SCOPOLAMINE 1 MG/3DAYS TD PT72
1.0000 | MEDICATED_PATCH | TRANSDERMAL | Status: DC
  Administered 2017-03-06: 1.5 mg via TRANSDERMAL

## 2017-03-06 MED ORDER — ROCURONIUM BROMIDE 100 MG/10ML IV SOLN
INTRAVENOUS | Status: DC | PRN
  Administered 2017-03-06: 40 mg via INTRAVENOUS
  Administered 2017-03-06: 10 mg via INTRAVENOUS

## 2017-03-06 MED ORDER — BUPIVACAINE HCL (PF) 0.25 % IJ SOLN
INTRAMUSCULAR | Status: DC | PRN
Start: 2017-03-06 — End: 2017-03-06
  Administered 2017-03-06: 30 mL

## 2017-03-06 MED ORDER — ONDANSETRON HCL 4 MG/2ML IJ SOLN
INTRAMUSCULAR | Status: DC | PRN
  Administered 2017-03-06: 4 mg via INTRAVENOUS

## 2017-03-06 MED ORDER — KETOROLAC TROMETHAMINE 30 MG/ML IJ SOLN
INTRAMUSCULAR | Status: DC | PRN
  Administered 2017-03-06: 30 mg via INTRAVENOUS

## 2017-03-06 MED ORDER — ACETAMINOPHEN 325 MG PO TABS: 650.0000 mg | ORAL_TABLET | Freq: Four times a day (QID) | ORAL | 0 refills | Status: DC | PRN

## 2017-03-06 MED ORDER — MIDAZOLAM HCL 2 MG/2ML IJ SOLN
INTRAMUSCULAR | Status: DC | PRN
  Administered 2017-03-06: 1 mg via INTRAVENOUS

## 2017-03-06 MED ORDER — FENTANYL CITRATE (PF) 100 MCG/2ML IJ SOLN
INTRAMUSCULAR | Status: DC | PRN
Start: 2017-03-06 — End: 2017-03-06
  Administered 2017-03-06 (×2): 50 ug via INTRAVENOUS

## 2017-03-06 MED ORDER — SUGAMMADEX SODIUM 200 MG/2ML IV SOLN
INTRAVENOUS | Status: AC
  Filled 2017-03-06: qty 2

## 2017-03-06 MED ORDER — OXYCODONE HCL 5 MG/5ML PO SOLN
5.0000 mg | Freq: Once | ORAL | Status: AC | PRN
  Administered 2017-03-06: 5 mg via ORAL
  Filled 2017-03-06: qty 5

## 2017-03-06 MED ORDER — ACETAMINOPHEN 10 MG/ML IV SOLN
INTRAVENOUS | Status: DC | PRN
  Administered 2017-03-06: 1000 mg via INTRAVENOUS

## 2017-03-06 MED ORDER — OXYCODONE HCL 5 MG PO TABS: 5.0000 mg | ORAL_TABLET | Freq: Once | ORAL | Status: AC | PRN

## 2017-03-06 MED ORDER — MEPERIDINE HCL 25 MG/ML IJ SOLN: 6.2500 mg | INTRAMUSCULAR | Status: DC | PRN

## 2017-03-06 MED ORDER — OXYCODONE HCL 5 MG PO CAPS: 5.0000 mg | ORAL_CAPSULE | Freq: Four times a day (QID) | ORAL | 0 refills | Status: DC | PRN

## 2017-03-06 MED ORDER — FENTANYL CITRATE (PF) 250 MCG/5ML IJ SOLN
INTRAMUSCULAR | Status: AC
  Filled 2017-03-06: qty 5

## 2017-03-06 MED ORDER — SODIUM CHLORIDE 0.9 % IR SOLN
Status: DC | PRN
  Administered 2017-03-06: 3000 mL

## 2017-03-06 MED ORDER — PROMETHAZINE HCL 25 MG/ML IJ SOLN: 6.2500 mg | INTRAMUSCULAR | Status: DC | PRN

## 2017-03-06 MED ORDER — KETOROLAC TROMETHAMINE 30 MG/ML IJ SOLN: 30.0000 mg | Freq: Once | INTRAMUSCULAR | Status: DC | PRN

## 2017-03-06 MED ORDER — PHENYLEPHRINE 40 MCG/ML (10ML) SYRINGE FOR IV PUSH (FOR BLOOD PRESSURE SUPPORT)
PREFILLED_SYRINGE | INTRAVENOUS | Status: AC
  Filled 2017-03-06: qty 10

## 2017-03-06 MED ORDER — LACTATED RINGERS IV SOLN
INTRAVENOUS | Status: DC
  Administered 2017-03-06: 11:00:00 via INTRAVENOUS

## 2017-03-06 MED ORDER — PROPOFOL 10 MG/ML IV BOLUS
INTRAVENOUS | Status: DC | PRN
  Administered 2017-03-06: 170 mg via INTRAVENOUS

## 2017-03-06 MED ORDER — KETOROLAC TROMETHAMINE 30 MG/ML IJ SOLN
INTRAMUSCULAR | Status: AC
  Filled 2017-03-06: qty 1

## 2017-03-06 MED ORDER — GLYCOPYRROLATE 0.2 MG/ML IJ SOLN
INTRAMUSCULAR | Status: AC
  Filled 2017-03-06: qty 1

## 2017-03-06 MED ORDER — LIDOCAINE HCL (CARDIAC) 20 MG/ML IV SOLN
INTRAVENOUS | Status: AC
  Filled 2017-03-06: qty 5

## 2017-03-06 SURGICAL SUPPLY — 35 items
ADH SKN CLS APL DERMABOND .7 (GAUZE/BANDAGES/DRESSINGS) ×1
APL SRG 38 LTWT LNG FL B (MISCELLANEOUS) ×1
APPLICATOR ARISTA FLEXITIP XL (MISCELLANEOUS) ×2 IMPLANT
BAG SPEC RTRVL LRG 6X4 10 (ENDOMECHANICALS) ×1
CLOTH BEACON ORANGE TIMEOUT ST (SAFETY) ×3 IMPLANT
DERMABOND ADVANCED (GAUZE/BANDAGES/DRESSINGS) ×2
DERMABOND ADVANCED .7 DNX12 (GAUZE/BANDAGES/DRESSINGS) ×1 IMPLANT
DRSG OPSITE POSTOP 3X4 (GAUZE/BANDAGES/DRESSINGS) ×2 IMPLANT
DURAPREP 26ML APPLICATOR (WOUND CARE) ×3 IMPLANT
GLOVE BIOGEL PI IND STRL 6.5 (GLOVE) ×2 IMPLANT
GLOVE BIOGEL PI IND STRL 7.0 (GLOVE) ×2 IMPLANT
GLOVE BIOGEL PI INDICATOR 6.5 (GLOVE) ×4
GLOVE BIOGEL PI INDICATOR 7.0 (GLOVE) ×4
GLOVE ECLIPSE 6.5 STRL STRAW (GLOVE) ×3 IMPLANT
GOWN STRL REUS W/TWL LRG LVL3 (GOWN DISPOSABLE) ×6 IMPLANT
HEMOSTAT ARISTA ABSORB 3G PWDR (MISCELLANEOUS) ×2 IMPLANT
NEEDLE INSUFFLATION 120MM (ENDOMECHANICALS) ×3 IMPLANT
PACK LAPAROSCOPY BASIN (CUSTOM PROCEDURE TRAY) ×3 IMPLANT
PACK TRENDGUARD 450 HYBRID PRO (MISCELLANEOUS) IMPLANT
PACK TRENDGUARD 600 HYBRD PROC (MISCELLANEOUS) IMPLANT
POUCH SPECIMEN RETRIEVAL 10MM (ENDOMECHANICALS) ×2 IMPLANT
PROTECTOR NERVE ULNAR (MISCELLANEOUS) ×6 IMPLANT
SET IRRIG TUBING LAPAROSCOPIC (IRRIGATION / IRRIGATOR) ×2 IMPLANT
SHEARS HARMONIC ACE PLUS 36CM (ENDOMECHANICALS) ×2 IMPLANT
SLEEVE XCEL OPT CAN 5 100 (ENDOMECHANICALS) ×3 IMPLANT
SOLUTION ELECTROLUBE (MISCELLANEOUS) ×3 IMPLANT
SUT MON AB 4-0 PS1 27 (SUTURE) ×3 IMPLANT
SUT VICRYL 0 UR6 27IN ABS (SUTURE) IMPLANT
TOWEL OR 17X24 6PK STRL BLUE (TOWEL DISPOSABLE) ×6 IMPLANT
TRAY FOLEY CATH SILVER 14FR (SET/KITS/TRAYS/PACK) ×3 IMPLANT
TRENDGUARD 450 HYBRID PRO PACK (MISCELLANEOUS) ×3
TRENDGUARD 600 HYBRID PROC PK (MISCELLANEOUS)
TROCAR XCEL NON-BLD 11X100MML (ENDOMECHANICALS) ×2 IMPLANT
TROCAR XCEL NON-BLD 5MMX100MML (ENDOMECHANICALS) ×3 IMPLANT
WARMER LAPAROSCOPE (MISCELLANEOUS) ×3 IMPLANT

## 2017-03-06 NOTE — OR Nursing (Signed)
CGB 186 in SS.

## 2017-03-06 NOTE — Op Note (Signed)
PREOPERATIVE DIAGNOSIS:  Adnexal mass POSTOPERATIVE DIAGNOSIS: same and pelvic adhesions PROCEDURE PERFORMED: Laparoscopic left salpingo-oophorectomy, right oophorectomy, lysis of adhesions SURGEON: Dr. Janyth Pupa ASSISTANT:Dr. Christophe Louis ANESTHESIA: General endotracheal.  ESTIMATED BLOOD LOSS: 30cc.  URINE OUTPUT: 100cc of clear yellow urine at the end of the procedure.  IV FLUIDS: 1200cc of crystalloid.  SPECIMEN(S): 1) left tube and ovary 2) right ovary 3) pelvic washings COMPLICATIONS: None.  CONDITION: Stable.  FINDINGS: No ascites or peritoneal studding was appreciated.  Liver, gallbladder and bowel appeared grossly normal.  Appendix visualized, unremarkable- right ovary with   Uterus normal size and shape.  Right ovary adherent to abdominal side wall and lower portion of appendix.  Right fallopian tube densely adherent to right side wall- not well visualized.  Normal left fallopian tube, left ovary with adhesions to pelvic side wall.  Informed consent was obtained from the patient prior to taking her to the operating room where anesthesia was found to be adequate. She was placed in dorsal lithotomy position and examined under anesthesia. She was prepped and draped in normal sterile fashion. The bladder was catheterized with a foley under sterile technique.  A bi-valve speculum was then placed and the anterior lip of the cervix was grasped with the single tooth tenaculum. Due to atrophy, a uterine manipulator was not placed. A sponge stick was placed instead. The speculum and tenaculum were then removed.  Attention was turned to the patients abdomen where a 10 mm infraumbilical skin incision was made with the scalpel. The veress needle was carefully introduced into the peritoneal cavity while tenting the abdominal wall. Intraperitoneal placement was confirmed by use of a saline-drop test.  The gas was connected and confirmed intrabdominal placement by a low initial pressure of 12mmHg. The  abdomen was then insuflated with CO2 gas. The trocar and sleeve were then advanced without difficulty into the abdomen under direct visualization. Intraabdominal placement was confirmed by the laparoscope and surveillance of the abdomen was performed. Findings as mentioned above.  Two additional 16mm skin incision were made in the left and right lower quadrants with placement of the trocar under direct visualization.   The right ovary adhesions were thin and taken down with the Harmonic.  The right ovary was placed on tension and using the Harmonic, the right infundipulopelvic ligament was clamped and ligated.  Due to the adhesions, the ovary was misplaced fairly high within the abdomen and a small portion was also adherent to the appendix and ligated.  Attention was turned to the left side.  The left tube and ovary were identified, using the Harmonic serially ligation was performed. Adhesion to the side wall was noted and care was taken to stay immediate adjacent to the ovary for dissection.  Excellent hemostasis was obtained.  Attention was turned back to the right to try and identify the right fallopian tube.  Due to the dense adhesions to the side wall and uterus- tube was difficulty to visualize and could not be safely removed.  The endocatch bag was inserted and both specimens were placed in the bag.  The abdominal incision was extended and the specimens were removed.  The umbilical trocar was reinserted and examination of the pelvis was performed.  Irrigation was performed and excellent hemostasis was noted.  Arista was placed.  All instruments and trocars were removed and air was allowed to fully escape.  The umbilical fascia was closed in a running fashion under direct visualization. The umbilical port site was closed with 4-0 vicryl.  The manipulator was removed.   The patient tolerated the procedure well with all sponge, lap, and needle counts correct. The patient was taken to recovery in stable  condition.  Dr. Landry Mellow was present to assist due to complexity of the case.  Janyth Pupa, DO [phone] (cell) [phone] (office)

## 2017-03-06 NOTE — Anesthesia Preprocedure Evaluation (Signed)
Anesthesia Evaluation  Patient identified by MRN, date of birth, ID band Patient awake    Reviewed: Allergy & Precautions, NPO status , Patient's Chart, lab work & pertinent test results  Airway Mallampati: II  TM Distance: >3 FB Neck ROM: Full    Dental no notable dental hx.    Pulmonary neg pulmonary ROS,    Pulmonary exam normal breath sounds clear to auscultation       Cardiovascular + CAD and + Peripheral Vascular Disease  Normal cardiovascular exam Rhythm:Regular Rate:Normal     Neuro/Psych negative neurological ROS  negative psych ROS   GI/Hepatic Neg liver ROS, GERD  ,  Endo/Other  negative endocrine ROSdiabetes  Renal/GU negative Renal ROS     Musculoskeletal negative musculoskeletal ROS (+)   Abdominal   Peds  Hematology negative hematology ROS (+)   Anesthesia Other Findings   Reproductive/Obstetrics                             Anesthesia Physical Anesthesia Plan  ASA: III  Anesthesia Plan: General   Post-op Pain Management:    Induction: Intravenous  PONV Risk Score and Plan: 4 or greater and Ondansetron, Dexamethasone, Midazolam, Scopolamine patch - Pre-op and Treatment may vary due to age or medical condition  Airway Management Planned: Oral ETT  Additional Equipment:   Intra-op Plan:   Post-operative Plan: Extubation in OR  Informed Consent: I have reviewed the patients History and Physical, chart, labs and discussed the procedure including the risks, benefits and alternatives for the proposed anesthesia with the patient or authorized representative who has indicated his/her understanding and acceptance.   Dental advisory given  Plan Discussed with: CRNA  Anesthesia Plan Comments:         Anesthesia Quick Evaluation

## 2017-03-06 NOTE — Anesthesia Procedure Notes (Addendum)
Procedure Name: Intubation Date/Time: 03/06/2017 12:17 PM Performed by: Georgeanne Nim Pre-anesthesia Checklist: Emergency Drugs available, Suction available, Patient being monitored and Timeout performed Patient Re-evaluated:Patient Re-evaluated prior to induction Oxygen Delivery Method: Circle system utilized Preoxygenation: Pre-oxygenation with 100% oxygen Induction Type: IV induction Ventilation: Mask ventilation without difficulty Laryngoscope Size: Mac and 3 Grade View: Grade III Tube size: 7.0 mm Number of attempts: 1 Airway Equipment and Method: Stylet and Video-laryngoscopy (unable to visualize cords with MAC 3;lowpro 3 blade with excellent view) Placement Confirmation: ETT inserted through vocal cords under direct vision,  positive ETCO2,  CO2 detector and breath sounds checked- equal and bilateral Secured at: 20 cm Tube secured with: Tape Dental Injury: Teeth and Oropharynx as per pre-operative assessment  Difficulty Due To: Difficulty was anticipated and Difficult Airway- due to anterior larynx

## 2017-03-06 NOTE — Anesthesia Postprocedure Evaluation (Signed)
Anesthesia Post Note  Patient: Isabella Galloway  Procedure(s) Performed: LAPAROSCOPIC Left Salpingectomy & OOPHORECTOMY, Right Oophorectomy (Bilateral Abdomen)     Patient location during evaluation: PACU Anesthesia Type: General Level of consciousness: sedated and patient cooperative Pain management: pain level controlled Vital Signs Assessment: post-procedure vital signs reviewed and stable Respiratory status: spontaneous breathing Cardiovascular status: stable Anesthetic complications: no    Last Vitals:  Vitals:   03/06/17 1445 03/06/17 1530  BP: 138/89 139/83  Pulse: (!) 103 (!) 102  Resp: 18 16  Temp: 36.9 C 36.8 C  SpO2: 97% 98%    Last Pain:  Vitals:   03/06/17 1600  TempSrc:   PainSc: 3    Pain Goal: Patients Stated Pain Goal: 2 (03/06/17 1600)               Nolon Nations

## 2017-03-06 NOTE — Interval H&P Note (Signed)
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 After consideration of risks, benefits and other options for treatment, the patient has consented to  Procedure(s): LAPAROSCOPIC BILATERAL SALPINGO OOPHORECTOMY (Bilateral) as a surgical intervention .  The patient's history has been reviewed, patient examined, no change in status, stable for surgery.  I have reviewed the patient's chart and labs.  Questions were answered to the patient's satisfaction.     Janyth Pupa, M

## 2017-03-06 NOTE — Discharge Instructions (Signed)
HOME INSTRUCTIONS  Please note any unusual or excessive bleeding, pain, swelling. Mild dizziness or drowsiness are normal for about 24 hours after surgery.   Shower when comfortable  Restrictions: No driving for 24 hours or while taking pain medications.  Activity:  No heavy lifting (> 10 lbs), nothing in vagina (no tampons, douching, or intercourse) x 2 weeks; no tub baths for 2 weeks Vaginal spotting is expected but if your bleeding is heavy, period like,  please call the office   Incision: the bandaids will fall off when they are ready to; you may clean your incision with mild soap and water but do not rub or scrub the incision site.  You may experience slight bloody drainage from your incision periodically.  This is normal.  If you experience a large amount of drainage or the incision opens, please call your physician who will likely direct you to the emergency department.  Diet:  You may return to your regular diet.  Do not eat large meals.  Eat small frequent meals throughout the day.  Continue to drink a good amount of water at least 6-8 glasses of water per day, hydration is very important for the healing process.  Pain Management: for pain management take Ibuprofen regularly.  You may also take tylenol.  For severe pain, you may take oxycodone as needed.  This medication may cause constipation- please take colace (stool softener) twice daily as needed.  Always take prescription pain medication with food, it may cause constipation, increase fluids and fiber and you may want to take an over-the-counter stool softener like Colace as needed up to 2x a day.    Alcohol -- Avoid for 24 hours and while taking pain medications.  Nausea: Take sips of ginger ale or soda  Fever -- Call physician if temperature over 101 degrees  Follow up:  If you do not already have a follow up appointment scheduled, please call the office at [phone].  If you experience fever (a temperature greater than  100.4), pain unrelieved by pain medication, shortness of breath, swelling of a single leg, or any other symptoms which are concerning to you please the office immediately.

## 2017-03-06 NOTE — Transfer of Care (Signed)
Immediate Anesthesia Transfer of Care Note  Patient: Isabella Galloway  Procedure(s) Performed: LAPAROSCOPIC Left Salpingectomy & OOPHORECTOMY, Right Oophorectomy (Bilateral Abdomen)  Patient Location: PACU  Anesthesia Type:General  Level of Consciousness: awake and patient cooperative  Airway & Oxygen Therapy: Patient Spontanous Breathing and Patient connected to nasal cannula oxygen  Post-op Assessment: Report given to RN and Post -op Vital signs reviewed and stable  Post vital signs: Reviewed and stable  Last Vitals:  Vitals:   03/06/17 1029 03/06/17 1336  BP: 135/80 (!) 145/83  Pulse: 96 96  Resp: 16   Temp: 36.8 C (P) 37 C  SpO2: 99%     Last Pain:  Vitals:   03/06/17 1029  TempSrc: Oral      Patients Stated Pain Goal: 2 (21/03/12 8118)  Complications: No apparent anesthesia complications

## 2017-03-07 ENCOUNTER — Encounter (HOSPITAL_COMMUNITY): Payer: Self-pay | Admitting: Obstetrics & Gynecology

## 2017-03-07 LAB — GLUCOSE, CAPILLARY: GLUCOSE-CAPILLARY: 146 mg/dL — AB (ref 65–99)

## 2017-03-12 ENCOUNTER — Telehealth: Payer: Self-pay | Admitting: *Deleted

## 2017-03-12 NOTE — Telephone Encounter (Signed)
Called and left Myrene a message at [facility] with at date/time we are holding an appt for the patient. Explained to Myrene that I will have the appt on hold, since the patient has my chart and will see the appt.

## 2017-03-27 ENCOUNTER — Telehealth: Payer: Self-pay | Admitting: Genetic Counselor

## 2017-03-27 ENCOUNTER — Ambulatory Visit: Payer: BLUE CROSS/BLUE SHIELD | Attending: Gynecologic Oncology | Admitting: Gynecologic Oncology

## 2017-03-27 ENCOUNTER — Encounter: Payer: Self-pay | Admitting: Gynecologic Oncology

## 2017-03-27 VITALS — BP 150/88 | HR 98 | Temp 97.7°F | Resp 18 | Ht 62.0 in | Wt 188.9 lb

## 2017-03-27 DIAGNOSIS — Z7984 Long term (current) use of oral hypoglycemic drugs: Secondary | ICD-10-CM | POA: Insufficient documentation

## 2017-03-27 DIAGNOSIS — Z79891 Long term (current) use of opiate analgesic: Secondary | ICD-10-CM | POA: Diagnosis not present

## 2017-03-27 DIAGNOSIS — Z803 Family history of malignant neoplasm of breast: Secondary | ICD-10-CM

## 2017-03-27 DIAGNOSIS — E669 Obesity, unspecified: Secondary | ICD-10-CM | POA: Diagnosis not present

## 2017-03-27 DIAGNOSIS — Z801 Family history of malignant neoplasm of trachea, bronchus and lung: Secondary | ICD-10-CM | POA: Insufficient documentation

## 2017-03-27 DIAGNOSIS — Z90722 Acquired absence of ovaries, bilateral: Secondary | ICD-10-CM | POA: Diagnosis not present

## 2017-03-27 DIAGNOSIS — E1165 Type 2 diabetes mellitus with hyperglycemia: Secondary | ICD-10-CM | POA: Insufficient documentation

## 2017-03-27 DIAGNOSIS — Z8249 Family history of ischemic heart disease and other diseases of the circulatory system: Secondary | ICD-10-CM | POA: Insufficient documentation

## 2017-03-27 DIAGNOSIS — E11319 Type 2 diabetes mellitus with unspecified diabetic retinopathy without macular edema: Secondary | ICD-10-CM

## 2017-03-27 DIAGNOSIS — K219 Gastro-esophageal reflux disease without esophagitis: Secondary | ICD-10-CM | POA: Diagnosis not present

## 2017-03-27 DIAGNOSIS — D099 Carcinoma in situ, unspecified: Secondary | ICD-10-CM | POA: Insufficient documentation

## 2017-03-27 DIAGNOSIS — Z6835 Body mass index (BMI) 35.0-35.9, adult: Secondary | ICD-10-CM

## 2017-03-27 DIAGNOSIS — E785 Hyperlipidemia, unspecified: Secondary | ICD-10-CM | POA: Diagnosis not present

## 2017-03-27 DIAGNOSIS — Z79899 Other long term (current) drug therapy: Secondary | ICD-10-CM | POA: Insufficient documentation

## 2017-03-27 DIAGNOSIS — F329 Major depressive disorder, single episode, unspecified: Secondary | ICD-10-CM | POA: Insufficient documentation

## 2017-03-27 DIAGNOSIS — C541 Malignant neoplasm of endometrium: Secondary | ICD-10-CM | POA: Diagnosis not present

## 2017-03-27 DIAGNOSIS — D0739 Carcinoma in situ of other female genital organs: Secondary | ICD-10-CM

## 2017-03-27 NOTE — Progress Notes (Signed)
Consult Note: Gyn-Onc  Consult was requested by Dr. Abran Cantor for the evaluation of Isabella Galloway 51 y.o. female  CC:  Chief Complaint  Patient presents with  . Malignant neoplasm of endometrium [facility])    Assessment/Plan:  Isabella Galloway  is a 51 y.o.  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  I discussed with Ms Castrellon that this is a premalignant condition. It is most commonly unilateral and can be more commonly identified in patients with BRCA mutations.  Of note, her ovaries were benign, as were washings, however she still has a right fallopian tube that was difficult to remove due to adhesive disease.  I am recommending genetic counseling to evaluate for BRCA.  I am recommending re-exploration with robotic assisted laparoscopy, lysis of adhesions and right salpingectomy.  I discussed that due to her obesity and diabetes she was at increased risk for complications including  bleeding, infection, damage to internal organs (such as bladder,ureters, bowels), blood clot, reoperation and rehospitalization.  I discussed that she should continue to optimize her blood sugar preoperatively.  She does not have a convincing reason for hysterectomy as STIC is an extrauterine pathology. Additionally, she is at increased risks from hysterectomy given her obesity, nulliparous status and diabetes.   She will consider the 2 OR dates provided her (one with my partner, the other with me) and contact us with the desired OR date when she has discussed further with her family.  Postoperatively, there is no specific recommended surveillance for women with a history of STIC.  While there continues to be a 1-5% risk for peritoneal carcinoma, there is no recommended screening guideline that has been shown to be associated with improved outcomes (eg frequent exams, tumor markers or imaging).   HPI: Isabella Galloway is a 51 year old G48 woman who is seen  in consultation at the request of Dr Nelda Marseille for serous tubal intraepithelial carcinoma (STIC).  The patient has a history of a right ovarian cyst (5cm) for many years. She desired removal of this cyst, and preoperative ROMA score was benign.  She was taken to the OR on 03/06/17 by Dr Janyth Pupa. Intraoperative findings included no ascites or peritoneal studding, normal appendix. Right ovary adherent to abdominal side wall and appendix. The tube was not well visualizd. The left fallopian tube and ovary were normal. Washings were obtained.  A bilateral oophorectomy and left salpingectomy was performed.    Final pathology revealed a benign cystadenoma of the right ovary, normal right fallopian tube and left fallopian tube with serous tubal intraepithelial carcinoma. The washings revealed reactive mesothelial cells (no malignancy).   Postoperatively she did well.  The patient has a long standing history of poorly controlled diabetes mellitus. Her most recent Hb A1c was 7%(down from 13%). She has diabetes retinopathy. She is obese with a BMI of 35kg/m2 and has visceral distribution of fat. Her only prior abdominal surgery was the laparoscopic oophorectomy/salpingectomy. She has never been pregnant.  She has a family history of a maternal GM with breast cancer in her 75's but no other first degree relatives with cancer.  Current Meds:  Outpatient Encounter Medications as of 03/27/2017  Medication Sig  . acetaminophen (TYLENOL) 325 MG tablet Take 2 tablets (650 mg total) by mouth every 6 (six) hours as needed.  . bifidobacterium infantis (ALIGN) capsule Take 1 capsule by mouth daily.  Marland Kitchen BYDUREON 2 MG PEN Inject 2 mg into the skin every 7 (  seven) days.   . cyclobenzaprine (FLEXERIL) 5 MG tablet Take 5-10 mg by mouth 3 (three) times daily as needed for muscle spasms.  Marland Kitchen docusate sodium (COLACE) 100 MG capsule Take 1 capsule (100 mg total) by mouth 2 (two) times daily.  Marland Kitchen escitalopram (LEXAPRO) 10 MG  tablet Take 10 mg by mouth daily.  Marland Kitchen ibuprofen (ADVIL,MOTRIN) 600 MG tablet Take 1 tablet (600 mg total) by mouth every 6 (six) hours as needed.  . Melatonin 5 MG CHEW Chew 1 tablet by mouth daily.   . meloxicam (MOBIC) 15 MG tablet Take 15 mg by mouth daily.  . metFORMIN (GLUCOPHAGE) 500 MG tablet TAKE 2 TABLETS(1000 MG) BY MOUTH TWICE DAILY WITH MEALS  . omeprazole (PRILOSEC) 40 MG capsule Take 40 mg by mouth daily.  Marland Kitchen oxycodone (OXY-IR) 5 MG capsule Take 1 capsule (5 mg total) by mouth every 6 (six) hours as needed.  Vladimir Faster Glyc-Propyl Glyc PF (SYSTANE PRESERVATIVE FREE) 0.4-0.3 % SOLN Apply 1-2 drops to eye 2 (two) times daily as needed (dryness).  . rosuvastatin (CRESTOR) 10 MG tablet Take 1 tablet (10 mg total) by mouth daily.  . traZODone (DESYREL) 50 MG tablet Take 50 mg by mouth at bedtime as needed for sleep.   . [DISCONTINUED] metFORMIN (GLUCOPHAGE) 500 MG tablet Take 1 tablet (500 mg total) by mouth 2 (two) times daily with a meal. (Patient taking differently: Take 1,000 mg by mouth 2 (two) times daily with a meal. )   No facility-administered encounter medications on file as of 03/27/2017.     Allergy: No Known Allergies  Social Hx:   Social History   Socioeconomic History  . Marital status: Single    Spouse name: Not on file  . Number of children: Not on file  . Years of education: Not on file  . Highest education level: Not on file  Social Needs  . Financial resource strain: Not on file  . Food insecurity - worry: Not on file  . Food insecurity - inability: Not on file  . Transportation needs - medical: Not on file  . Transportation needs - non-medical: Not on file  Occupational History  . Not on file  Tobacco Use  . Smoking status: Never Smoker  . Smokeless tobacco: Never Used  Substance and Sexual Activity  . Alcohol use: No  . Drug use: No  . Sexual activity: Not Currently    Birth control/protection: None, Post-menopausal  Other Topics Concern  . Not  on file  Social History Narrative  . Not on file    Past Surgical Hx:  Past Surgical History:  Procedure Laterality Date  . BREAST SURGERY Left    benign with metal clip  . COLONOSCOPY     polyp  . PILONIDAL CYST DRAINAGE  1980  . tailbone fx & subsequent cyst with excision    . UPPER GI ENDOSCOPY    . WISDOM TOOTH EXTRACTION      Past Medical Hx:  Past Medical History:  Diagnosis Date  . Anxiety   . Depression   . Diabetes mellitus without complication ([place])   . GERD (gastroesophageal reflux disease)   . History of kidney stones    passed stone - no surgery  . Hyperlipidemia   . Peripheral vascular disease ([place])   . Sleep apnea    Does not use CPAP  . Treadmill stress test negative for angina pectoris    NORMAL PER PATIENT    Past Gynecological History:  Nulliparous. Postmenopausal.  Patient's last menstrual period was 10/16/2011 (exact date).  Family Hx:  Family History  Problem Relation Age of Onset  . Heart failure Mother   . COPD Mother   . Cancer Mother        lung cancer  . Cancer Maternal Grandmother        breast cancer  . Heart attack Maternal Grandfather     Review of Systems:  Constitutional  Feels well,    ENT Normal appearing ears and nares bilaterally Skin/Breast  No rash, sores, jaundice, itching, dryness Cardiovascular  No chest pain, shortness of breath, or edema  Pulmonary  No cough or wheeze.  Gastro Intestinal  No nausea, vomitting, or diarrhoea. No bright red blood per rectum, no abdominal pain, change in bowel movement, or constipation.  Genito Urinary  No frequency, urgency, dysuria, no bleeding Musculo Skeletal  No myalgia, arthralgia, joint swelling or pain  Neurologic  No weakness, numbness, change in gait,  Psychology  No depression, anxiety, insomnia.   Vitals:  Blood pressure (!) 150/88, pulse 98, temperature 97.7 F (36.5 C), temperature source Oral, resp. rate 18, height _0  (1.575 m), weight 188 lb 14.4 oz  (85.7 kg), last menstrual period 10/16/2011, SpO2 100 %.  Physical Exam: WD in NAD Neck  Supple NROM, without any enlargements.  Lymph Node Survey No cervical supraclavicular or inguinal adenopathy Cardiovascular  Pulse normal rate, regularity and rhythm. S1 and S2 normal.  Lungs  Clear to auscultation bilateraly, without wheezes/crackles/rhonchi. Good air movement.  Skin  No rash/lesions/breakdown  Psychiatry  Alert and oriented to person, place, and time  Abdomen  Normoactive bowel sounds, abdomen soft, non-tender and obese without evidence of hernia.  Back No CVA tenderness Genito Urinary  Patient declined Rectal  Patient declined Extremities  No bilateral cyanosis, clubbing or edema.   Donaciano Eva, MD  03/27/2017, 11:21 AM

## 2017-03-27 NOTE — Patient Instructions (Addendum)
First available time for robotic removal of the right fallopian tube and lysis of adhesions is December 13th with Dr Janie Morning. First available time with Dr Denman George is January 8th.  We will arrange for you to meet with the genetic counselor regarding BRCA testing.  Contact Dr Serita Grit office when you have decided the timing for your surgery [phone]                Preparing for your Surgery  Potential Surgery dates include Apr 25, 2017 with Dr. Skeet Latch or May 21, 2017 with Dr. Skeet Latch.  You will be scheduled for a robotic assisted right salpingectomy, lysis of adhesions.  Pre-operative Testing -You will receive a phone call from presurgical testing at [facility] to arrange for a pre-operative testing appointment before your surgery.  This appointment normally occurs one to two weeks before your scheduled surgery.   -Bring your insurance card, copy of an advanced directive if applicable, medication list  -At that visit, you will be asked to sign a consent for a possible blood transfusion in case a transfusion becomes necessary during surgery.  The need for a blood transfusion is rare but having consent is a necessary part of your care.     -You should not be taking blood thinners or aspirin at least ten days prior to surgery unless instructed by your surgeon.  Day Before Surgery at [place] will be asked to take in a light diet the day before surgery.  Avoid carbonated beverages.  You will be advised to have nothing to eat or drink after midnight the evening before.    Eat a light diet the day before surgery.  Examples including soups, broths, toast, yogurt, mashed potatoes.  Things to avoid include carbonated beverages  (fizzy beverages), raw fruits and raw vegetables, or beans.   If your bowels are filled with gas, your surgeon will have difficulty visualizing your pelvic organs which increases your surgical risks.  Your role in recovery Your role is to become active  as soon as directed by your doctor, while still giving yourself time to heal.  Rest when you feel tired. You will be asked to do the following in order to speed your recovery:  - Cough and breathe deeply. This helps toclear and expand your lungs and can prevent pneumonia. You may be given a spirometer to practice deep breathing. A staff member will show you how to use the spirometer. - Do mild physical activity. Walking or moving your legs help your circulation and body functions return to normal. A staff member will help you when you try to walk and will provide you with simple exercises. Do not try to get up or walk alone the first time. - Actively manage your pain. Managing your pain lets you move in comfort. We will ask you to rate your pain on a scale of zero to 10. It is your responsibility to tell your doctor or nurse where and how much you hurt so your pain can be treated.  Special Considerations -If you are diabetic, you may be placed on insulin after surgery to have closer control over your blood sugars to promote healing and recovery.  This does not mean that you will be discharged on insulin.  If applicable, your oral antidiabetics will be resumed when you are tolerating a solid diet.  -Your final pathology results from surgery should be available by the Friday after surgery and the results will be relayed to you  when available.  -Dr. Lahoma Crocker is the Surgeon that assists your GYN Oncologist with surgery.  The next day after your surgery you will either see your GYN Oncologist or Dr. Lahoma Crocker.   Blood Transfusion Information WHAT IS A BLOOD TRANSFUSION? A transfusion is the replacement of blood or some of its parts. Blood is made up of multiple cells which provide different functions.  Red blood cells carry oxygen and are used for blood loss replacement.  White blood cells fight against infection.  Platelets control bleeding.  Plasma helps clot blood.  Other  blood products are available for specialized needs, such as hemophilia or other clotting disorders. BEFORE THE TRANSFUSION  Who gives blood for transfusions?   You may be able to donate blood to be used at a later date on yourself (autologous donation).  Relatives can be asked to donate blood. This is generally not any safer than if you have received blood from a stranger. The same precautions are taken to ensure safety when a relative's blood is donated.  Healthy volunteers who are fully evaluated to make sure their blood is safe. This is blood bank blood. Transfusion therapy is the safest it has ever been in the practice of medicine. Before blood is taken from a donor, a complete history is taken to make sure that person has no history of diseases nor engages in risky social behavior (examples are intravenous drug use or sexual activity with multiple partners). The donor's travel history is screened to minimize risk of transmitting infections, such as malaria. The donated blood is tested for signs of infectious diseases, such as HIV and hepatitis. The blood is then tested to be sure it is compatible with you in order to minimize the chance of a transfusion reaction. If you or a relative donates blood, this is often done in anticipation of surgery and is not appropriate for emergency situations. It takes many days to process the donated blood. RISKS AND COMPLICATIONS Although transfusion therapy is very safe and saves many lives, the main dangers of transfusion include:   Getting an infectious disease.  Developing a transfusion reaction. This is an allergic reaction to something in the blood you were given. Every precaution is taken to prevent this. The decision to have a blood transfusion has been considered carefully by your caregiver before blood is given. Blood is not given unless the benefits outweigh the risks.

## 2017-03-27 NOTE — Telephone Encounter (Signed)
Genetic counseling appt has been scheduled for the pt to see Ofri on 11/29 at 1230pm. Sharyn Lull in gyn onc will notify the pt.

## 2017-04-02 ENCOUNTER — Telehealth: Payer: Self-pay | Admitting: Gynecologic Oncology

## 2017-04-02 NOTE — Telephone Encounter (Signed)
Called to follow up with patient about surgical dates.  Patient was supposed to call when she decided on a surgery date.  Advised her that the dates given to her have already booked up.  She states she had went to [facility] and they had sooner OR dates so she is going to have surgery there but keep her genetics appointment here.  Advised to call for any needs or concerns.

## 2017-04-11 ENCOUNTER — Other Ambulatory Visit: Payer: BLUE CROSS/BLUE SHIELD

## 2017-04-11 ENCOUNTER — Encounter: Payer: Self-pay | Admitting: Genetic Counselor

## 2017-04-11 ENCOUNTER — Ambulatory Visit (HOSPITAL_BASED_OUTPATIENT_CLINIC_OR_DEPARTMENT_OTHER): Payer: BLUE CROSS/BLUE SHIELD | Admitting: Genetic Counselor

## 2017-04-11 DIAGNOSIS — Z803 Family history of malignant neoplasm of breast: Secondary | ICD-10-CM | POA: Insufficient documentation

## 2017-04-11 DIAGNOSIS — Z1379 Encounter for other screening for genetic and chromosomal anomalies: Secondary | ICD-10-CM

## 2017-04-11 DIAGNOSIS — Z801 Family history of malignant neoplasm of trachea, bronchus and lung: Secondary | ICD-10-CM | POA: Diagnosis not present

## 2017-04-11 DIAGNOSIS — Z808 Family history of malignant neoplasm of other organs or systems: Secondary | ICD-10-CM

## 2017-04-11 DIAGNOSIS — C5702 Malignant neoplasm of left fallopian tube: Secondary | ICD-10-CM | POA: Diagnosis not present

## 2017-04-11 DIAGNOSIS — Z7183 Encounter for nonprocreative genetic counseling: Secondary | ICD-10-CM

## 2017-04-11 HISTORY — DX: Encounter for other screening for genetic and chromosomal anomalies: Z13.79

## 2017-04-11 NOTE — Progress Notes (Signed)
[facility]      Initial Visit   Patient Name: Isabella Galloway Patient DOB: July 13, 1965 Patient Age: 51 y.o. Encounter Date: 04/11/2017  Referring Provider: Everitt Amber, MD Margaretmary Bayley, MD  Primary Care Provider: Gennette Pac, NP  Reason for Visit: Evaluate for hereditary susceptibility to cancer    Assessment and Plan:  . Isabella Galloway's family history is not highly suggestive of a hereditary predisposition to cancer, but both her mother and maternal grandmother had their ovaries removed which alters their own risk of cancer. Furthermore, her father had no sisters and she does not know the medical history of any paternal relatives.    . Testing is recommended to determine whether she has a pathogenic mutation that will impact her screening and risk-reduction for cancer. A negative result will be reassuring.  . Isabella Galloway wished to pursue genetic testing and a blood sample will be sent for analysis of the 83 genes on [facility]'s Multi-Cancer panel (ALK, APC, ATM, AXIN2, BAP1, BARD1, BLM, BMPR1A, BRCA1, BRCA2, BRIP1, CASR, CDC73, CDH1, CDK4, CDKN1B, CDKN1C, CDKN2A, CEBPA, CHEK2, CTNNA1, DICER1, DIS3L2, EGFR, EPCAM, FH, FLCN, GATA2, GPC3, GREM1, HOXB13, HRAS, KIT, MAX, MEN1, MET, MITF, MLH1, MSH2, MSH3, MSH6, MUTYH, NBN, NF1, NF2, NTHL1, PALB2, PDGFRA, PHOX2B, PMS2, POLD1, POLE, POT1, PRKAR1A, PTCH1, PTEN, RAD50, RAD51C, RAD51D, RB1, RECQL4, RET, RUNX1, SDHA, SDHAF2, SDHB, SDHC, SDHD, SMAD4, SMARCA4, SMARCB1, SMARCE1, STK11, SUFU, TERC, TERT, TMEM127, TP53, TSC1, TSC2, VHL, WRN, WT1).   . Results should be available in approximately 2-4 weeks, at which point we will contact her and address implications for her as well as address genetic testing for at-risk family members, if needed.     Dr. Jana Hakim was available for questions concerning this case. Total time spent by me in face-to-face counseling was approximately 35 minutes.    _____________________________________________________________________   History of Present Illness: Isabella Galloway, a 51 y.o. female, is being seen at the [facility] due to a personal and family history. She presents to clinic today to discuss the possibility of a hereditary predisposition to cancer and discuss whether genetic testing is warranted.  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 This was an incidental finding after surgery for an adnexal mass. She is scheduled for additional surgery at [facility] on 04/15/17    Past Medical History:  Diagnosis Date  . Anxiety   . Depression   . Diabetes mellitus without complication ([place])   . Family history of breast cancer   . GERD (gastroesophageal reflux disease)   . History of kidney stones    passed stone - no surgery  . Hyperlipidemia   . Peripheral vascular disease ([place])   . Sleep apnea    Does not use CPAP  . Treadmill stress test negative for angina pectoris    NORMAL PER PATIENT    Past Surgical History:  Procedure Laterality Date  . BREAST SURGERY Left    benign with metal clip  . COLONOSCOPY     polyp  . LAPAROSCOPIC BILATERAL SALPINGO OOPHERECTOMY Bilateral 03/06/2017   Procedure: LAPAROSCOPIC Left Salpingectomy & OOPHORECTOMY, Right Oophorectomy;  Surgeon: Janyth Pupa, DO;  Location: [facility];  Service: Gynecology;  Laterality: Bilateral;  . PILONIDAL CYST DRAINAGE  1980  . tailbone fx & subsequent cyst with excision    . UPPER GI ENDOSCOPY    . WISDOM TOOTH EXTRACTION  Social History   Socioeconomic History  . Marital status: Single    Spouse name: Not on file  . Number of children: Not on file  . Years of education: Not on file  . Highest education level: Not on file  Social Needs  . Financial resource strain: Not on file  . Food insecurity - worry: Not on file  . Food insecurity - inability: Not on  file  . Transportation needs - medical: Not on file  . Transportation needs - non-medical: Not on file  Occupational History  . Not on file  Tobacco Use  . Smoking status: Never Smoker  . Smokeless tobacco: Never Used  Substance and Sexual Activity  . Alcohol use: No  . Drug use: No  . Sexual activity: Not Currently    Birth control/protection: None, Post-menopausal  Other Topics Concern  . Not on file  Social History Narrative  . Not on file     Family History:  During the visit, a 4-generation pedigree was obtained. Family tree will be scanned in the Media tab in Epic  Significant diagnoses include the following:  Family History  Problem Relation Age of Onset  . Heart failure Mother   . COPD Mother   . Cancer Mother        lung cancer  . Other Mother        pituitary tumor  . Breast cancer Maternal Grandmother 39       deceased 75  . Heart attack Maternal Grandfather   . Cancer Other        mat granmother's sisters: one with leukemia and 2 with kidney ca at older ages    Additionally, Isabella Galloway has no children. She has no full siblings and one paternal half-sister, but does not have any information about her or any paternal relatives. Her mother had 2 brothers. Both her mother and her maternal grandmother had TAH/BSO; her mother was in her mid 58s.  Isabella Galloway ancestry is Caucasian - NOS. There is no known Jewish ancestry and no consanguinity.  Discussion: We reviewed the characteristics, features and inheritance patterns of hereditary cancer syndromes. We discussed her risk of harboring a mutation in the context of her personal and family history. We discussed that her unknown paternal family and paucity of women make risk assessment challenging. We discussed the process of genetic testing, insurance coverage and implications of results: positive, negative and variant of unknown significance (VUS).    Isabella Galloway questions were answered to her  satisfaction today and she is welcome to call with any additional questions or concerns. Thank you for the referral and allowing [place] to share in the care of your patient.    Steele Berg, MS, [place] Certified Genetic Counselor phone: [phone] [email or web address]   ______________________________________________________________________ For Office Staff:  Number of people involved in session: 1 Was an Intern/ student involved with case: no

## 2017-04-22 ENCOUNTER — Ambulatory Visit: Payer: Self-pay | Admitting: Genetic Counselor

## 2017-04-22 ENCOUNTER — Encounter: Payer: Self-pay | Admitting: Genetic Counselor

## 2017-04-22 DIAGNOSIS — Z1379 Encounter for other screening for genetic and chromosomal anomalies: Secondary | ICD-10-CM

## 2017-04-22 NOTE — Progress Notes (Signed)
               [facility]etics Clinic       Genetic Test Results    Patient Name: Isabella Galloway Patient DOB: 02/04/1966 Patient Age: 51 y.o. Encounter Date: 04/22/2017  Referring Provider: Emma Rossi, MD Linda Van Le, MD  Primary Care Provider: Clark, Candice P, NP   Isabella Galloway was called today to discuss genetic test results. Please see the Genetics note from her visit on 04/11/2017 for a detailed discussion of her personal and family history.  Genetic Testing: At the time of Isabella Galloway's visit, she decided to pursue genetic testing of multiple genes associated with hereditary susceptibility to cancer. Testing included sequencing and deletion/duplication analysis. Testing did not reveal any pathogenic mutation in any of these genes.  A copy of the genetic test report will be scanned into Epic under the media tab.  The genes analyzed were the 83 genes on [facility]'s Multi-Cancer panel (ALK, APC, ATM, AXIN2, BAP1, BARD1, BLM, BMPR1A, BRCA1, BRCA2, BRIP1, CASR, CDC73, CDH1, CDK4, CDKN1B, CDKN1C, CDKN2A, CEBPA, CHEK2, CTNNA1, DICER1, DIS3L2, EGFR, EPCAM, FH, FLCN, GATA2, GPC3, GREM1, HOXB13, HRAS, KIT, MAX, MEN1, MET, MITF, MLH1, MSH2, MSH3, MSH6, MUTYH, NBN, NF1, NF2, NTHL1, PALB2, PDGFRA, PHOX2B, PMS2, POLD1, POLE, POT1, PRKAR1A, PTCH1, PTEN, RAD50, RAD51C, RAD51D, RB1, RECQL4, RET, RUNX1, SDHA, SDHAF2, SDHB, SDHC, SDHD, SMAD4, SMARCA4, SMARCB1, SMARCE1, STK11, SUFU, TERC, TERT, TMEM127, TP53, TSC1, TSC2, VHL, WRN, WT1).  Since the current test is not perfect, it is possible that there may be a gene mutation that current testing cannot detect, but that chance is small. It is possible that a different genetic factor, which has not yet been discovered or is not on this panel, is responsible for the cancer diagnoses in the family. Again, the likelihood of this is low. No additional testing is recommended at this time for Isabella Galloway.  Two Variants of Uncertain Significance  were detected: BLM c.1877A>T (p.[id number]) and POLE c.3857G>A (p.[id number]). This is still considered a normal result. While at this time, it is unknown if this finding is associated with increased cancer risk, the majority of these variants get reclassified to be inconsequential. We emphasized that medical management should not be based on this finding. With time, we suspect the lab will determine the significance, if any. If we do learn more about it, we will try to contact Isabella Galloway to discuss it further. It is important to stay in touch with us periodically and keep the address and phone number up to date.  Cancer Screening: These results suggest that Isabella Galloway's cancer was most likely not due to an inherited predisposition. Most cancers happen by chance and this test, along with details of her family history, suggests that her cancer falls into this category. We discussed continuing to follow the cancer screening guidelines provided by her physician.   Family Members: Family members may be at some increased risk of developing cancer, over the general population risk, simply due to the family history. They are recommended to speak with their own providers about appropriate cancer screenings.  Any relative who had cancer at a young age or had a particularly rare cancer may also wish to pursue genetic testing. Genetic counselors can be located in other cities, by visiting the website of the National Society of Genetic Counselors (www.nsgc.org) and searching for a genetic counselor by zip code.   Family members are not recommended to get tested for the above VUS outside of a research   protocol as this finding has no implications for their medical management.    Lastly, cancer genetics is a rapidly advancing field and it is possible that new genetic tests will be appropriate for Isabella Galloway in the future. We encourage her to remain in contact with us on an annual basis so we can update her  personal and family histories, and let her know of advances in cancer genetics that may benefit the family. Our contact number was provided. Isabella Galloway is welcome to call anytime with additional questions.     Ofri Leitner, MS, CGC Certified Genetic Counselor phone: [phone] 

## 2017-05-14 HISTORY — PX: INCISION AND DRAINAGE FOOT: SHX1800

## 2017-12-30 ENCOUNTER — Other Ambulatory Visit: Payer: Self-pay | Admitting: Family

## 2017-12-30 DIAGNOSIS — Z1231 Encounter for screening mammogram for malignant neoplasm of breast: Secondary | ICD-10-CM

## 2018-01-21 ENCOUNTER — Other Ambulatory Visit: Payer: Self-pay | Admitting: Cardiovascular Disease

## 2018-01-23 ENCOUNTER — Ambulatory Visit
Admission: RE | Admit: 2018-01-23 | Discharge: 2018-01-23 | Disposition: A | Discharge status: Home / Self Care | Payer: BLUE CROSS/BLUE SHIELD | Source: Ambulatory Visit | Attending: Family | Admitting: Family

## 2018-01-23 ENCOUNTER — Ambulatory Visit: Payer: BLUE CROSS/BLUE SHIELD

## 2018-01-23 DIAGNOSIS — Z1231 Encounter for screening mammogram for malignant neoplasm of breast: Secondary | ICD-10-CM

## 2019-11-13 ENCOUNTER — Emergency Department (HOSPITAL_COMMUNITY): Payer: Medicare Other

## 2019-11-13 ENCOUNTER — Other Ambulatory Visit: Payer: Self-pay

## 2019-11-13 ENCOUNTER — Inpatient Hospital Stay (HOSPITAL_COMMUNITY)
Admission: EM | Admit: 2019-11-13 | Discharge: 2019-11-16 | DRG: 854 | Disposition: A | Discharge status: Home / Self Care | Payer: Medicare Other | Attending: Urology | Admitting: Urology

## 2019-11-13 ENCOUNTER — Encounter (HOSPITAL_COMMUNITY): Payer: Self-pay

## 2019-11-13 DIAGNOSIS — N132 Hydronephrosis with renal and ureteral calculous obstruction: Secondary | ICD-10-CM | POA: Diagnosis present

## 2019-11-13 DIAGNOSIS — N201 Calculus of ureter: Secondary | ICD-10-CM | POA: Diagnosis present

## 2019-11-13 DIAGNOSIS — R609 Edema, unspecified: Secondary | ICD-10-CM | POA: Diagnosis not present

## 2019-11-13 DIAGNOSIS — K219 Gastro-esophageal reflux disease without esophagitis: Secondary | ICD-10-CM | POA: Diagnosis present

## 2019-11-13 DIAGNOSIS — Z806 Family history of leukemia: Secondary | ICD-10-CM

## 2019-11-13 DIAGNOSIS — K5900 Constipation, unspecified: Secondary | ICD-10-CM | POA: Diagnosis present

## 2019-11-13 DIAGNOSIS — A419 Sepsis, unspecified organism: Secondary | ICD-10-CM | POA: Diagnosis not present

## 2019-11-13 DIAGNOSIS — Z825 Family history of asthma and other chronic lower respiratory diseases: Secondary | ICD-10-CM

## 2019-11-13 DIAGNOSIS — Z87442 Personal history of urinary calculi: Secondary | ICD-10-CM

## 2019-11-13 DIAGNOSIS — Z79891 Long term (current) use of opiate analgesic: Secondary | ICD-10-CM

## 2019-11-13 DIAGNOSIS — Z9851 Tubal ligation status: Secondary | ICD-10-CM

## 2019-11-13 DIAGNOSIS — Z801 Family history of malignant neoplasm of trachea, bronchus and lung: Secondary | ICD-10-CM

## 2019-11-13 DIAGNOSIS — Z803 Family history of malignant neoplasm of breast: Secondary | ICD-10-CM

## 2019-11-13 DIAGNOSIS — Z7984 Long term (current) use of oral hypoglycemic drugs: Secondary | ICD-10-CM

## 2019-11-13 DIAGNOSIS — R112 Nausea with vomiting, unspecified: Secondary | ICD-10-CM

## 2019-11-13 DIAGNOSIS — E1151 Type 2 diabetes mellitus with diabetic peripheral angiopathy without gangrene: Secondary | ICD-10-CM | POA: Diagnosis present

## 2019-11-13 DIAGNOSIS — Z8249 Family history of ischemic heart disease and other diseases of the circulatory system: Secondary | ICD-10-CM

## 2019-11-13 DIAGNOSIS — E785 Hyperlipidemia, unspecified: Secondary | ICD-10-CM | POA: Diagnosis present

## 2019-11-13 DIAGNOSIS — F418 Other specified anxiety disorders: Secondary | ICD-10-CM | POA: Diagnosis present

## 2019-11-13 DIAGNOSIS — M549 Dorsalgia, unspecified: Secondary | ICD-10-CM | POA: Diagnosis present

## 2019-11-13 DIAGNOSIS — G8929 Other chronic pain: Secondary | ICD-10-CM | POA: Diagnosis present

## 2019-11-13 DIAGNOSIS — Z79899 Other long term (current) drug therapy: Secondary | ICD-10-CM

## 2019-11-13 DIAGNOSIS — Z791 Long term (current) use of non-steroidal anti-inflammatories (NSAID): Secondary | ICD-10-CM

## 2019-11-13 DIAGNOSIS — Z8051 Family history of malignant neoplasm of kidney: Secondary | ICD-10-CM

## 2019-11-13 DIAGNOSIS — N23 Unspecified renal colic: Secondary | ICD-10-CM

## 2019-11-13 DIAGNOSIS — Z20822 Contact with and (suspected) exposure to covid-19: Secondary | ICD-10-CM | POA: Diagnosis present

## 2019-11-13 LAB — LACTIC ACID, PLASMA: Lactic Acid, Venous: 1.8 mmol/L (ref 0.5–1.9)

## 2019-11-13 LAB — COMPREHENSIVE METABOLIC PANEL
ALT: 38 U/L (ref 0–44)
AST: 27 U/L (ref 15–41)
Albumin: 3.6 g/dL (ref 3.5–5.0)
Alkaline Phosphatase: 55 U/L (ref 38–126)
Anion gap: 11 (ref 5–15)
BUN: 12 mg/dL (ref 6–20)
CO2: 24 mmol/L (ref 22–32)
Calcium: 9 mg/dL (ref 8.9–10.3)
Chloride: 99 mmol/L (ref 98–111)
Creatinine, Ser: 0.83 mg/dL (ref 0.44–1.00)
GFR calc Af Amer: >60 mL/min (ref >60)
GFR calc non Af Amer: >60 mL/min (ref >60)
Glucose, Bld: 258 mg/dL — ABNORMAL HIGH (ref 70–99)
Potassium: 4 mmol/L (ref 3.5–5.1)
Sodium: 134 mmol/L — ABNORMAL LOW (ref 135–145)
Total Bilirubin: 0.9 mg/dL (ref 0.3–1.2)
Total Protein: 6.9 g/dL (ref 6.5–8.1)

## 2019-11-13 LAB — URINALYSIS, COMPLETE (UACMP) WITH MICROSCOPIC
Bilirubin Urine: NEGATIVE
Glucose, UA: 50 mg/dL — AB
Ketones, ur: 20 mg/dL — AB
Nitrite: NEGATIVE
Protein, ur: 30 mg/dL — AB
Specific Gravity, Urine: 1.018 (ref 1.005–1.030)
pH: 6 (ref 5.0–8.0)

## 2019-11-13 LAB — LIPASE, BLOOD: Lipase: 27 U/L (ref 11–51)

## 2019-11-13 LAB — SARS CORONAVIRUS 2 BY RT PCR (HOSPITAL ORDER, PERFORMED IN ~~LOC~~ HOSPITAL LAB): SARS Coronavirus 2: NEGATIVE

## 2019-11-13 LAB — CBG MONITORING, ED: Glucose-Capillary: 243 mg/dL — ABNORMAL HIGH (ref 70–99)

## 2019-11-13 LAB — CBC
HCT: 44 % (ref 36.0–46.0)
Hemoglobin: 14.3 g/dL (ref 12.0–15.0)
MCH: 30.6 pg (ref 26.0–34.0)
MCHC: 32.5 g/dL (ref 30.0–36.0)
MCV: 94.2 fL (ref 80.0–100.0)
Platelets: 220 10*3/uL (ref 150–400)
RBC: 4.67 MIL/uL (ref 3.87–5.11)
RDW: 13.1 % (ref 11.5–15.5)
WBC: 15.4 10*3/uL — ABNORMAL HIGH (ref 4.0–10.5)
nRBC: 0 % (ref 0.0–0.2)

## 2019-11-13 MED ORDER — MORPHINE SULFATE (PF) 4 MG/ML IV SOLN
4.0000 mg | Freq: Once | INTRAVENOUS | Status: AC
  Administered 2019-11-13: 4 mg via INTRAVENOUS
  Filled 2019-11-13: qty 1

## 2019-11-13 MED ORDER — ONDANSETRON HCL 4 MG/2ML IJ SOLN
4.0000 mg | Freq: Once | INTRAMUSCULAR | Status: AC
  Administered 2019-11-13: 4 mg via INTRAVENOUS
  Filled 2019-11-13: qty 2

## 2019-11-13 MED ORDER — SODIUM CHLORIDE 0.9 % IV BOLUS (SEPSIS)
1000.0000 mL | Freq: Once | INTRAVENOUS | Status: AC
  Administered 2019-11-13: 1000 mL via INTRAVENOUS

## 2019-11-13 MED ORDER — IOHEXOL 300 MG/ML  SOLN
100.0000 mL | Freq: Once | INTRAMUSCULAR | Status: AC | PRN
  Administered 2019-11-13: 100 mL via INTRAVENOUS

## 2019-11-13 MED ORDER — PIPERACILLIN-TAZOBACTAM 3.375 G IVPB 30 MIN
3.3750 g | Freq: Once | INTRAVENOUS | Status: AC
  Administered 2019-11-13: 3.375 g via INTRAVENOUS

## 2019-11-13 MED ORDER — ACETAMINOPHEN 325 MG PO TABS
650.0000 mg | ORAL_TABLET | Freq: Once | ORAL | Status: DC
  Filled 2019-11-13 (×2): qty 2

## 2019-11-13 MED ORDER — PIPERACILLIN-TAZOBACTAM 3.375 G IVPB: 3.3750 g | Freq: Three times a day (TID) | INTRAVENOUS | Status: DC

## 2019-11-13 NOTE — ED Notes (Signed)
This RN called [place] to advise them that patient was ready and available for scan when they're ready.

## 2019-11-13 NOTE — ED Triage Notes (Signed)
Pt arrives to ED w/ c/o 9/10 R sided flank pain that started yesterday. Pt states she has gallstones and kidney stones. Pt also endorses n/v, decreased appetite. Pt has fever of 102.9.

## 2019-11-13 NOTE — ED Notes (Signed)
Pt transported to Radiology 

## 2019-11-13 NOTE — ED Provider Notes (Signed)
[facility] EMERGENCY DEPARTMENT Provider Note   CSN: [id number] Arrival date & time: 11/13/19  1826     History Chief Complaint  Patient presents with  . Flank Pain    Isabella Galloway is a 54 y.o. female with a history of diabetes, presented to emergency department with fevers chills and dysuria.  Patient is also been having dry cough.  She reports she was told she had a left-sided kidney stone a few weeks ago.  Says the pain mostly gone away.  However she noted recurrence of urinary frequency for the past several days.  She began having pain in her right flank in her right upper quadrant.  She also been having nausea and vomiting at home.  She also reports a dry cough for several days and feels like the cough is worsening her abdominal pain.  She has received both doses of the Covid vaccine.  She has a history of bilateral tubal ligation and ovary removal (both).  She says she does have a history of gallstones and biliary sludge seen on outpatient imaging in the past.  She also reports that she gets lower back and spinal steroid injections for chronic back pain.  She had this done recently.  She is having a flareup of pain in her usual location in the midline of her lower back.  No sciatica, numbness, or weakness in legs.    HPI     Past Medical History:  Diagnosis Date  . Anxiety   . Depression   . Diabetes mellitus without complication ([place])   . Family history of breast cancer   . Genetic testing 04/11/2017   Multi-Cancer panel (83 genes) @ [facility] - No pathogenic mutations detected  . GERD (gastroesophageal reflux disease)   . History of kidney stones    passed stone - no surgery  . Hyperlipidemia   . Peripheral vascular disease ([place])   . Sleep apnea    Does not use CPAP  . Treadmill stress test negative for angina pectoris    NORMAL PER PATIENT    Patient Active Problem List   Diagnosis Date Noted  . Ureteral stone 11/14/2019  . Genetic  testing 04/11/2017  . Family history of breast cancer   . 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 Coronary artery calcification seen on CAT scan 10/05/2016  . Diabetes mellitus type 2 in obese ([place]) 10/05/2016  . Dyslipidemia 10/05/2016    Past Surgical History:  Procedure Laterality Date  . BREAST SURGERY Left    benign with metal clip  . COLONOSCOPY     polyp  . LAPAROSCOPIC BILATERAL SALPINGO OOPHERECTOMY Bilateral 03/06/2017   Procedure: LAPAROSCOPIC Left Salpingectomy & OOPHORECTOMY, Right Oophorectomy;  Surgeon: Janyth Pupa, DO;  Location: [facility];  Service: Gynecology;  Laterality: Bilateral;  . PILONIDAL CYST DRAINAGE  1980  . tailbone fx & subsequent cyst with excision    . UPPER GI ENDOSCOPY    . WISDOM TOOTH EXTRACTION       OB History   No obstetric history on file.     Family History  Problem Relation Age of Onset  . Heart failure Mother   . COPD Mother   . Cancer Mother        lung cancer  . Other Mother        pituitary tumor  . Breast cancer Maternal Grandmother 19       deceased 79  . Heart attack Maternal Grandfather   . Cancer Other  mat granmother's sisters: one with leukemia and 2 with kidney ca at older ages    Social History   Tobacco Use  . Smoking status: Never Smoker  . Smokeless tobacco: Never Used  Vaping Use  . Vaping Use: Never used  Substance Use Topics  . Alcohol use: No  . Drug use: No    Home Medications Prior to Admission medications   Medication Sig Start Date End Date Taking? Authorizing Provider  acetaminophen (TYLENOL) 325 MG tablet Take 2 tablets (650 mg total) by mouth every 6 (six) hours as needed. Patient taking differently: Take 650 mg by mouth every 6 (six) hours as needed for moderate pain.  03/06/17  Yes Janyth Pupa, DO  bifidobacterium infantis (ALIGN) capsule Take 1 capsule by mouth daily.   Yes [provider]  cyclobenzaprine (FLEXERIL) 5 MG tablet Take 5-10 mg by mouth 3 (three) times  daily as needed for muscle spasms.   Yes [provider]  docusate sodium (COLACE) 100 MG capsule Take 1 capsule (100 mg total) by mouth 2 (two) times daily. Patient taking differently: Take 100 mg by mouth daily as needed for mild constipation.  03/06/17  Yes Janyth Pupa, DO  escitalopram (LEXAPRO) 20 MG tablet Take 20 mg by mouth daily. 09/30/19  Yes [provider]  gabapentin (NEURONTIN) 300 MG capsule Take 300-600 mg by mouth 3 (three) times daily. Take 1 capsule (300 mg) BID Then Take 2 capsules (600 mg) at bedtime   Yes [provider]  HYDROcodone-acetaminophen (NORCO/VICODIN) 5-325 MG tablet Take 1 tablet by mouth every 6 (six) hours as needed for moderate pain or severe pain.  10/22/19  Yes [provider]  Melatonin 5 MG CHEW Chew 1 tablet by mouth at bedtime as needed (sleep).    Yes [provider]  meloxicam (MOBIC) 15 MG tablet Take 15 mg by mouth daily as needed for pain.    Yes [provider]  metFORMIN (GLUCOPHAGE) 500 MG tablet Take 1,000 mg by mouth 2 (two) times daily with a meal.  12/27/16  Yes [provider]  omeprazole (PRILOSEC) 40 MG capsule Take 40 mg by mouth daily as needed (indigestion).    Yes [provider]  ondansetron (ZOFRAN) 4 MG tablet Take 4 mg by mouth every 8 (eight) hours as needed for nausea or vomiting.  05/26/19  Yes [provider]  OZEMPIC, 1 MG/DOSE, 4 MG/3ML SOPN Inject 1 mg into the skin once a week.  11/05/19  Yes [provider]  Polyethyl Glyc-Propyl Glyc PF (SYSTANE PRESERVATIVE FREE) 0.4-0.3 % SOLN Apply 1-2 drops to eye 2 (two) times daily as needed (dryness).   Yes [provider]  repaglinide (PRANDIN) 1 MG tablet Take 1 mg by mouth daily as needed (blood sugar >125).  08/28/19  Yes [provider]  rosuvastatin (CRESTOR) 10 MG tablet Take 1 tablet (10 mg total) by mouth daily. NEED OV. 01/21/18  Yes Croitoru, Mihai, MD  tamsulosin (FLOMAX)  0.4 MG CAPS capsule Take 0.4 mg by mouth daily. 10/22/19  Yes [provider]  traZODone (DESYREL) 50 MG tablet Take 50 mg by mouth at bedtime as needed for sleep.    Yes [provider]  Vitamin D, Ergocalciferol, (DRISDOL) 1.25 MG (50000 UNIT) CAPS capsule Take 50,000 Units by mouth every 14 (fourteen) days. 11/04/19  Yes [provider]  BYDUREON 2 MG PEN Inject 2 mg into the skin every 7 (seven) days.  Patient not taking: Reported on 11/13/2019  02/07/17   [provider]  escitalopram (LEXAPRO) 10 MG tablet Take 10 mg by mouth daily. Patient not taking: Reported on 11/13/2019    [provider]  gabapentin (NEURONTIN) 250 MG/5ML solution Take by mouth. Patient not taking: Reported on 11/13/2019 10/05/19   [provider]  ibuprofen (ADVIL,MOTRIN) 600 MG tablet Take 1 tablet (600 mg total) by mouth every 6 (six) hours as needed. Patient not taking: Reported on 11/13/2019 03/06/17   Janyth Pupa, DO  oxycodone (OXY-IR) 5 MG capsule Take 1 capsule (5 mg total) by mouth every 6 (six) hours as needed. Patient not taking: Reported on 11/13/2019 03/06/17   Janyth Pupa, DO    Allergies    Patient has no known allergies.  Review of Systems   Review of Systems  Constitutional: Positive for appetite change, chills and fever.  HENT: Negative for ear pain and sore throat.   Eyes: Negative for pain and visual disturbance.  Respiratory: Negative for cough and shortness of breath.   Cardiovascular: Negative for chest pain and palpitations.  Gastrointestinal: Positive for abdominal pain, nausea and vomiting.  Genitourinary: Positive for dysuria, flank pain and frequency. Negative for hematuria.  Musculoskeletal: Positive for back pain and myalgias.  Skin: Negative for rash and wound.  Neurological: Negative for syncope and light-headedness.  Psychiatric/Behavioral: Negative for agitation and confusion.  All other systems reviewed and are  negative.   Physical Exam Updated Vital Signs BP (!) 115/101   Pulse (!) 106   Temp 98.9 F (37.2 C) (Oral)   Resp 18   Ht 5\' 2"  (1.575 m)   Wt 86.6 kg   LMP 10/16/2011 (Exact Date)   SpO2 96%   BMI 34.93 kg/m   Physical Exam Vitals and nursing note reviewed.  Constitutional:      General: She is not in acute distress.    Appearance: She is well-developed. She is obese.  HENT:     Head: Normocephalic and atraumatic.  Eyes:     Conjunctiva/sclera: Conjunctivae normal.  Cardiovascular:     Rate and Rhythm: Regular rhythm. Tachycardia present.     Pulses: Normal pulses.  Pulmonary:     Effort: Pulmonary effort is normal. No respiratory distress.     Breath sounds: Normal breath sounds.  Abdominal:     Palpations: Abdomen is soft.     Tenderness: There is abdominal tenderness in the right upper quadrant and epigastric area. There is no rebound. Positive signs include Murphy's sign. Negative signs include McBurney's sign.  Musculoskeletal:     Cervical back: Neck supple.     Comments: Lower back paraspinal tenderness and midline L spine tenderness  Skin:    General: Skin is warm and dry.  Neurological:     General: No focal deficit present.     Mental Status: She is alert and oriented to person, place, and time.     Sensory: No sensory deficit.     Motor: No weakness.  Psychiatric:        Mood and Affect: Mood normal.        Behavior: Behavior normal.     ED Results / Procedures / Treatments   Labs (all labs ordered are listed, but only abnormal results are displayed) Labs Reviewed  COMPREHENSIVE METABOLIC PANEL - Abnormal; Notable for the following components:      Result Value   Sodium 134 (*)    Glucose, Bld 258 (*)    All other components within normal limits  CBC - Abnormal;  Notable for the following components:   WBC 15.4 (*)    All other components within normal limits  URINALYSIS, COMPLETE (UACMP) WITH MICROSCOPIC - Abnormal; Notable for the following  components:   APPearance HAZY (*)    Glucose, UA 50 (*)    Hgb urine dipstick SMALL (*)    Ketones, ur 20 (*)    Protein, ur 30 (*)    Leukocytes,Ua SMALL (*)    Bacteria, UA RARE (*)    Non Squamous Epithelial 0-5 (*)    All other components within normal limits  CBG MONITORING, ED - Abnormal; Notable for the following components:   Glucose-Capillary 243 (*)    All other components within normal limits  SARS CORONAVIRUS 2 BY RT PCR (HOSPITAL ORDER, [facility])  CULTURE, BLOOD (ROUTINE X 2)  CULTURE, BLOOD (ROUTINE X 2)  LIPASE, BLOOD  LACTIC ACID, PLASMA  BASIC METABOLIC PANEL  CBC    EKG EKG Interpretation  Date/Time:  Friday November 13 2019 20:45:45 EDT Ventricular Rate:  118 PR Interval:    QRS Duration: 99 QT Interval:  321 QTC Calculation: 450 R Axis:   28 Text Interpretation: Sinus tachycardia Low voltage, extremity and precordial leads Consider anterior infarct No STEMI Confirmed by Octaviano Glow [phone]) on 11/13/2019 9:07:50 PM   Radiology CT ABDOMEN PELVIS W CONTRAST  Result Date: 11/13/2019 CLINICAL DATA:  History of kidney stones, fever and dysuria EXAM: CT ABDOMEN AND PELVIS WITH CONTRAST TECHNIQUE: Multidetector CT imaging of the abdomen and pelvis was performed using the standard protocol following bolus administration of intravenous contrast. CONTRAST:  152mL OMNIPAQUE IOHEXOL 300 MG/ML  SOLN COMPARISON:  Ultrasound 11/13/2019, CT 10/22/2019 FINDINGS: Lower chest: Lung bases demonstrate hazy dependent atelectasis. No acute consolidation or pleural effusion. Hepatobiliary: No calcified gallstone.  No biliary dilatation. Pancreas: Unremarkable. No pancreatic ductal dilatation or surrounding inflammatory changes. Spleen: Normal in size without focal abnormality. Adrenals/Urinary Tract: Stable 10 mm right adrenal nodule. Normal left adrenal gland. Similar mild left hydronephrosis and proximal hydroureter, secondary to a 3 x 4 mm stone in the  proximal left ureter, without significant distal migration. The urinary bladder shows subtle foci of hyperdense density, possibly representing early excreted contrast mixed with urine. Stomach/Bowel: Stomach is within normal limits. Appendix appears normal. No evidence of bowel wall thickening, distention, or inflammatory changes. Vascular/Lymphatic: Mild aortic atherosclerosis. No aneurysm. No suspicious nodes Reproductive: Probable fibroid in the left lower uterine segment. No adnexal mass. Other: Negative for free air or free fluid. Musculoskeletal: No acute or significant osseous findings. IMPRESSION: 1. No significant change in mild left hydronephrosis and proximal hydroureter secondary to a 3 x 4 mm stone in the proximal left ureter. This is without significant distal migration since comparison CT from 10/22/2019. 2. Probable uterine fibroid. Electronically Signed   By: Donavan Foil M.D.   On: 11/13/2019 23:20   DG Chest Port 1 View  Result Date: 11/13/2019 CLINICAL DATA:  Right-sided flank pain. EXAM: PORTABLE CHEST 1 VIEW COMPARISON:  None. FINDINGS: There is no evidence of acute infiltrate, pleural effusion or pneumothorax. The heart size and mediastinal contours are within normal limits. The visualized skeletal structures are unremarkable. IMPRESSION: No active disease. Electronically Signed   By: Virgina Norfolk M.D.   On: 11/13/2019 20:56   US Abdomen Limited RUQ  Result Date: 11/13/2019 CLINICAL DATA:  Fever and right-sided pain with nausea and vomiting. EXAM: ULTRASOUND ABDOMEN LIMITED RIGHT UPPER QUADRANT COMPARISON:  None. FINDINGS: Gallbladder: No gallstones or  wall thickening visualized (1.9 mm). No sonographic Murphy sign noted by sonographer. Common bile duct: Diameter: 6.1 mm Liver: No focal lesion identified. Diffusely increased echogenicity of the liver parenchyma is noted. Portal vein is patent on color Doppler imaging with normal direction of blood flow towards the liver. Other:  None. IMPRESSION: Fatty liver. Electronically Signed   By: Virgina Norfolk M.D.   On: 11/13/2019 22:13    Procedures .Critical Care Performed by: Wyvonnia Dusky, MD Authorized by: Wyvonnia Dusky, MD   Critical care provider statement:    Critical care time (minutes):  45   Critical care was necessary to treat or prevent imminent or life-threatening deterioration of the following conditions:  Sepsis   Critical care was time spent personally by me on the following activities:  Discussions with consultants, evaluation of patient's response to treatment, examination of patient, ordering and performing treatments and interventions, ordering and review of laboratory studies, ordering and review of radiographic studies, pulse oximetry, re-evaluation of patient's condition, obtaining history from patient or surrogate and review of old charts   (including critical care time)  Medications Ordered in ED Medications  acetaminophen (TYLENOL) tablet 650 mg (0 mg Oral Hold 11/13/19 2128)  piperacillin-tazobactam (ZOSYN) IVPB 3.375 g (0 g Intravenous Stopped 11/13/19 2313)    Followed by  piperacillin-tazobactam (ZOSYN) IVPB 3.375 g (has no administration in time range)  rosuvastatin (CRESTOR) tablet 10 mg (has no administration in time range)  escitalopram (LEXAPRO) tablet 20 mg (has no administration in time range)  traZODone (DESYREL) tablet 50 mg (has no administration in time range)  acidophilus (RISAQUAD) capsule 1 capsule (has no administration in time range)  docusate sodium (COLACE) capsule 100 mg (has no administration in time range)  pantoprazole (PROTONIX) EC tablet 40 mg (has no administration in time range)  ondansetron (ZOFRAN) tablet 4 mg (has no administration in time range)  tamsulosin (FLOMAX) capsule 0.4 mg (has no administration in time range)  cyclobenzaprine (FLEXERIL) tablet 5-10 mg (has no administration in time range)  0.9 %  sodium chloride infusion (has no administration  in time range)  cefTRIAXone (ROCEPHIN) 1 g in sodium chloride 0.9 % 100 mL IVPB (has no administration in time range)  acetaminophen (TYLENOL) tablet 650 mg (has no administration in time range)  HYDROmorphone (DILAUDID) injection 0.5-1 mg (has no administration in time range)  diphenhydrAMINE (BENADRYL) injection 12.5 mg (has no administration in time range)    Or  diphenhydrAMINE (BENADRYL) 12.5 MG/5ML elixir 12.5 mg (has no administration in time range)  ondansetron (ZOFRAN) injection 4 mg (4 mg Intravenous Given 11/14/19 0058)  gabapentin (NEURONTIN) capsule 300 mg (has no administration in time range)    And  gabapentin (NEURONTIN) capsule 600 mg (has no administration in time range)  sodium chloride 0.9 % bolus 1,000 mL (1,000 mLs Intravenous New Bag/Given 11/13/19 2124)    And  sodium chloride 0.9 % bolus 1,000 mL (1,000 mLs Intravenous New Bag/Given 11/13/19 2124)    And  sodium chloride 0.9 % bolus 1,000 mL (1,000 mLs Intravenous New Bag/Given 11/13/19 2124)  morphine 4 MG/ML injection 4 mg (4 mg Intravenous Given 11/13/19 2122)  ondansetron (ZOFRAN) injection 4 mg (4 mg Intravenous Given 11/13/19 2122)  iohexol (OMNIPAQUE) 300 MG/ML solution 100 mL (100 mLs Intravenous Contrast Given 11/13/19 2256)    ED Course  I have reviewed the triage vital signs and the nursing notes.  Pertinent labs & imaging results that were available during my care of the patient were  reviewed by me and considered in my medical decision making (see chart for details).  54 yo female presenting with fever, fatigue, dysuria for several days.  History of left sided kidney stone imaged 3 weeks ago.  She does not feel she's passed it.  She's also having some RUQ tenderness on exam and reports a history of gallstones on prior imaging.    Source for fever is most likely urinary with her persistent urinary frequency and known retained stone.  Alternatively may be biliary disease or diverticulitis or other intraabdominal  process.  She reports a dry cough as well - cannot exclude viral URI entirely.  Xray shows no PNA per my interpretation.  Covid test is negative and she is vaccinated.  IV sepsis labs ordered and reviewed IV antibiotics ordered Zosyn given likely urinary or intraabdominal source.  30 cc/kg IV bolus ordered.  IV morphine for pain and IV zofran for nausea  WBC 15.4.  Lactate wnl.  No sx of spinal cord compression.  I think the likelihood of spinal abscess is lower on my differential at this time, as I am more suspicious for infected retained kidney stone or biliary disease.  Update - RUQ ultrasound with no stigmata of acute cholecystitis.  LFT's wnl.  Lipase wnl.  Less likely acute biliary disease.  Clinical Course as of Nov 13 101  Fri Nov 13, 2019  2330  IMPRESSION: 1. No significant change in mild left hydronephrosis and proximal hydroureter secondary to a 3 x 4 mm stone in the proximal left ureter. This is without significant distal migration since comparison CT from 10/22/2019. 2. Probable uterine fibroid.   [MT]  2355 Spoke to Dr Karie Georges of urology who will come see patient, feels this may be an issue with an infected stone.  Pt may need transfer to [facility] for removal.  She was updated on this information.  Signed out to Dr Lorelle Formosa overnight EDP awaiting urology consultation and admission advice   [MT]  2358 At this point, her symptoms seem localized around the urinary system and less likely related to spinal infection, and I agree this is the most likely source of her symptoms   [MT]    Clinical Course User Index [MT] Ha Placeres, Carola Rhine, MD   Final Clinical Impression(s) / ED Diagnoses Final diagnoses:  Nausea & vomiting  Ureteral colic  Sepsis without acute organ dysfunction, due to unspecified organism [facility])    Rx / DC Orders ED Discharge Orders    None       Viveka Wilmeth, Carola Rhine, MD 11/14/19 0104

## 2019-11-13 NOTE — ED Notes (Signed)
Per MD Trifan, Tylenol Administration is not required at this time and necessity will be evaluated at a later time.

## 2019-11-14 ENCOUNTER — Inpatient Hospital Stay (HOSPITAL_COMMUNITY): Payer: Medicare Other | Admitting: Certified Registered Nurse Anesthetist

## 2019-11-14 ENCOUNTER — Encounter (HOSPITAL_COMMUNITY): Payer: Self-pay | Admitting: Urology

## 2019-11-14 ENCOUNTER — Inpatient Hospital Stay (HOSPITAL_COMMUNITY): Payer: Medicare Other

## 2019-11-14 ENCOUNTER — Other Ambulatory Visit: Payer: Self-pay

## 2019-11-14 ENCOUNTER — Encounter (HOSPITAL_COMMUNITY)
Admission: EM | Disposition: A | Discharge status: Home / Self Care | Payer: Self-pay | Source: Home / Self Care | Attending: Urology

## 2019-11-14 DIAGNOSIS — Z79899 Other long term (current) drug therapy: Secondary | ICD-10-CM | POA: Diagnosis not present

## 2019-11-14 DIAGNOSIS — N132 Hydronephrosis with renal and ureteral calculous obstruction: Secondary | ICD-10-CM | POA: Diagnosis present

## 2019-11-14 DIAGNOSIS — N201 Calculus of ureter: Secondary | ICD-10-CM | POA: Diagnosis present

## 2019-11-14 DIAGNOSIS — Z803 Family history of malignant neoplasm of breast: Secondary | ICD-10-CM | POA: Diagnosis not present

## 2019-11-14 DIAGNOSIS — Z20822 Contact with and (suspected) exposure to covid-19: Secondary | ICD-10-CM | POA: Diagnosis present

## 2019-11-14 DIAGNOSIS — Z791 Long term (current) use of non-steroidal anti-inflammatories (NSAID): Secondary | ICD-10-CM | POA: Diagnosis not present

## 2019-11-14 DIAGNOSIS — Z7984 Long term (current) use of oral hypoglycemic drugs: Secondary | ICD-10-CM | POA: Diagnosis not present

## 2019-11-14 DIAGNOSIS — Z8051 Family history of malignant neoplasm of kidney: Secondary | ICD-10-CM | POA: Diagnosis not present

## 2019-11-14 DIAGNOSIS — Z79891 Long term (current) use of opiate analgesic: Secondary | ICD-10-CM | POA: Diagnosis not present

## 2019-11-14 DIAGNOSIS — F418 Other specified anxiety disorders: Secondary | ICD-10-CM | POA: Diagnosis present

## 2019-11-14 DIAGNOSIS — K5900 Constipation, unspecified: Secondary | ICD-10-CM | POA: Diagnosis present

## 2019-11-14 DIAGNOSIS — E1151 Type 2 diabetes mellitus with diabetic peripheral angiopathy without gangrene: Secondary | ICD-10-CM | POA: Diagnosis present

## 2019-11-14 DIAGNOSIS — E785 Hyperlipidemia, unspecified: Secondary | ICD-10-CM | POA: Diagnosis present

## 2019-11-14 DIAGNOSIS — A419 Sepsis, unspecified organism: Secondary | ICD-10-CM | POA: Diagnosis present

## 2019-11-14 DIAGNOSIS — Z87442 Personal history of urinary calculi: Secondary | ICD-10-CM | POA: Diagnosis not present

## 2019-11-14 DIAGNOSIS — Z806 Family history of leukemia: Secondary | ICD-10-CM | POA: Diagnosis not present

## 2019-11-14 DIAGNOSIS — Z801 Family history of malignant neoplasm of trachea, bronchus and lung: Secondary | ICD-10-CM | POA: Diagnosis not present

## 2019-11-14 DIAGNOSIS — Z825 Family history of asthma and other chronic lower respiratory diseases: Secondary | ICD-10-CM | POA: Diagnosis not present

## 2019-11-14 DIAGNOSIS — M549 Dorsalgia, unspecified: Secondary | ICD-10-CM | POA: Diagnosis present

## 2019-11-14 DIAGNOSIS — Z9851 Tubal ligation status: Secondary | ICD-10-CM | POA: Diagnosis not present

## 2019-11-14 DIAGNOSIS — K219 Gastro-esophageal reflux disease without esophagitis: Secondary | ICD-10-CM | POA: Diagnosis present

## 2019-11-14 DIAGNOSIS — Z8249 Family history of ischemic heart disease and other diseases of the circulatory system: Secondary | ICD-10-CM | POA: Diagnosis not present

## 2019-11-14 DIAGNOSIS — G8929 Other chronic pain: Secondary | ICD-10-CM | POA: Diagnosis present

## 2019-11-14 HISTORY — PX: CYSTOSCOPY WITH STENT PLACEMENT: SHX5790

## 2019-11-14 LAB — GLUCOSE, CAPILLARY
Glucose-Capillary: 214 mg/dL — ABNORMAL HIGH (ref 70–99)
Glucose-Capillary: 204 mg/dL — ABNORMAL HIGH (ref 70–99)
Glucose-Capillary: 183 mg/dL — ABNORMAL HIGH (ref 70–99)
Glucose-Capillary: 204 mg/dL — ABNORMAL HIGH (ref 70–99)
Glucose-Capillary: 235 mg/dL — ABNORMAL HIGH (ref 70–99)
Glucose-Capillary: 216 mg/dL — ABNORMAL HIGH (ref 70–99)

## 2019-11-14 LAB — SURGICAL PCR SCREEN
MRSA, PCR: NEGATIVE
Staphylococcus aureus: NEGATIVE

## 2019-11-14 LAB — HEMOGLOBIN A1C
Hgb A1c MFr Bld: 8.1 % — ABNORMAL HIGH (ref 4.8–5.6)
Mean Plasma Glucose: 185.77 mg/dL

## 2019-11-14 SURGERY — CYSTOSCOPY, WITH STENT INSERTION
Anesthesia: General | Site: Ureter | Laterality: Left

## 2019-11-14 MED ORDER — ONDANSETRON HCL 4 MG/2ML IJ SOLN
4.0000 mg | INTRAMUSCULAR | Status: DC | PRN
  Administered 2019-11-14 (×5): 4 mg via INTRAVENOUS
  Filled 2019-11-14 (×5): qty 2

## 2019-11-14 MED ORDER — PROPOFOL 500 MG/50ML IV EMUL
INTRAVENOUS | Status: DC | PRN
  Administered 2019-11-14: 100 mg via INTRAVENOUS

## 2019-11-14 MED ORDER — TRAZODONE HCL 50 MG PO TABS: 50.0000 mg | ORAL_TABLET | Freq: Every evening | ORAL | Status: DC | PRN

## 2019-11-14 MED ORDER — RISAQUAD PO CAPS
1.0000 | ORAL_CAPSULE | Freq: Every day | ORAL | Status: DC
  Administered 2019-11-15 – 2019-11-16 (×2): 1 via ORAL
  Filled 2019-11-14 (×2): qty 1

## 2019-11-14 MED ORDER — ONDANSETRON HCL 4 MG/2ML IJ SOLN: 4.0000 mg | Freq: Once | INTRAMUSCULAR | Status: DC | PRN

## 2019-11-14 MED ORDER — SODIUM CHLORIDE 0.9 % IR SOLN
Status: DC | PRN
  Administered 2019-11-14: 3000 mL

## 2019-11-14 MED ORDER — DOCUSATE SODIUM 100 MG PO CAPS
100.0000 mg | ORAL_CAPSULE | Freq: Two times a day (BID) | ORAL | Status: DC
  Administered 2019-11-14 – 2019-11-16 (×4): 100 mg via ORAL
  Filled 2019-11-14 (×4): qty 1

## 2019-11-14 MED ORDER — HYDROMORPHONE HCL 1 MG/ML IJ SOLN: 0.2500 mg | INTRAMUSCULAR | Status: DC | PRN

## 2019-11-14 MED ORDER — DEXAMETHASONE SODIUM PHOSPHATE 10 MG/ML IJ SOLN
INTRAMUSCULAR | Status: DC | PRN
  Administered 2019-11-14: 10 mg via INTRAVENOUS

## 2019-11-14 MED ORDER — SODIUM CHLORIDE 0.9 % IV SOLN
1.0000 g | INTRAVENOUS | Status: DC
  Administered 2019-11-14 – 2019-11-15 (×3): 1 g via INTRAVENOUS
  Filled 2019-11-14: qty 10
  Filled 2019-11-14 (×2): qty 1

## 2019-11-14 MED ORDER — HYDROMORPHONE HCL 1 MG/ML IJ SOLN
0.5000 mg | INTRAMUSCULAR | Status: DC | PRN
  Administered 2019-11-14 (×5): 1 mg via INTRAVENOUS
  Administered 2019-11-15: 0.5 mg via INTRAVENOUS
  Filled 2019-11-14 (×7): qty 1

## 2019-11-14 MED ORDER — LACTATED RINGERS IV SOLN: INTRAVENOUS | Status: DC | PRN

## 2019-11-14 MED ORDER — MIDAZOLAM HCL 2 MG/2ML IJ SOLN
INTRAMUSCULAR | Status: AC
  Filled 2019-11-14: qty 2

## 2019-11-14 MED ORDER — LIDOCAINE 2% (20 MG/ML) 5 ML SYRINGE
INTRAMUSCULAR | Status: AC
  Filled 2019-11-14: qty 5

## 2019-11-14 MED ORDER — TAMSULOSIN HCL 0.4 MG PO CAPS
0.4000 mg | ORAL_CAPSULE | Freq: Every day | ORAL | Status: DC
  Administered 2019-11-15 – 2019-11-16 (×2): 0.4 mg via ORAL
  Filled 2019-11-14 (×2): qty 1

## 2019-11-14 MED ORDER — GABAPENTIN 300 MG PO CAPS
600.0000 mg | ORAL_CAPSULE | Freq: Every day | ORAL | Status: DC
  Administered 2019-11-15: 600 mg via ORAL
  Filled 2019-11-14 (×2): qty 2

## 2019-11-14 MED ORDER — ONDANSETRON HCL 4 MG/2ML IJ SOLN
INTRAMUSCULAR | Status: DC | PRN
  Administered 2019-11-14: 4 mg via INTRAVENOUS

## 2019-11-14 MED ORDER — ESCITALOPRAM OXALATE 20 MG PO TABS
20.0000 mg | ORAL_TABLET | Freq: Every day | ORAL | Status: DC
  Administered 2019-11-15 – 2019-11-16 (×2): 20 mg via ORAL
  Filled 2019-11-14 (×2): qty 1

## 2019-11-14 MED ORDER — LIDOCAINE 2% (20 MG/ML) 5 ML SYRINGE
INTRAMUSCULAR | Status: DC | PRN
  Administered 2019-11-14: 100 mg via INTRAVENOUS

## 2019-11-14 MED ORDER — GABAPENTIN 300 MG PO CAPS: 300.0000 mg | ORAL_CAPSULE | Freq: Three times a day (TID) | ORAL | Status: DC

## 2019-11-14 MED ORDER — PROPOFOL 1000 MG/100ML IV EMUL
INTRAVENOUS | Status: AC
  Filled 2019-11-14: qty 100

## 2019-11-14 MED ORDER — ACETAMINOPHEN 325 MG PO TABS: 650.0000 mg | ORAL_TABLET | ORAL | Status: DC | PRN

## 2019-11-14 MED ORDER — PANTOPRAZOLE SODIUM 40 MG PO TBEC
40.0000 mg | DELAYED_RELEASE_TABLET | Freq: Every day | ORAL | Status: DC
  Administered 2019-11-15 – 2019-11-16 (×2): 40 mg via ORAL
  Filled 2019-11-14 (×2): qty 1

## 2019-11-14 MED ORDER — INSULIN ASPART 100 UNIT/ML ~~LOC~~ SOLN
SUBCUTANEOUS | Status: AC
  Filled 2019-11-14: qty 1

## 2019-11-14 MED ORDER — OXYCODONE HCL 5 MG/5ML PO SOLN: 5.0000 mg | Freq: Once | ORAL | Status: DC | PRN

## 2019-11-14 MED ORDER — OXYCODONE HCL 5 MG PO TABS: 5.0000 mg | ORAL_TABLET | Freq: Once | ORAL | Status: DC | PRN

## 2019-11-14 MED ORDER — FENTANYL CITRATE (PF) 100 MCG/2ML IJ SOLN
INTRAMUSCULAR | Status: DC | PRN
  Administered 2019-11-14: 25 ug via INTRAVENOUS
  Administered 2019-11-14: 50 ug via INTRAVENOUS
  Administered 2019-11-14: 25 ug via INTRAVENOUS

## 2019-11-14 MED ORDER — SODIUM CHLORIDE 0.9 % IV SOLN: INTRAVENOUS | Status: DC

## 2019-11-14 MED ORDER — DIPHENHYDRAMINE HCL 50 MG/ML IJ SOLN: 12.5000 mg | Freq: Four times a day (QID) | INTRAMUSCULAR | Status: DC | PRN

## 2019-11-14 MED ORDER — IOHEXOL 300 MG/ML  SOLN
INTRAMUSCULAR | Status: DC | PRN
  Administered 2019-11-14: 4 mL

## 2019-11-14 MED ORDER — ACETAMINOPHEN 10 MG/ML IV SOLN: 1000.0000 mg | Freq: Once | INTRAVENOUS | Status: DC | PRN

## 2019-11-14 MED ORDER — INSULIN ASPART 100 UNIT/ML ~~LOC~~ SOLN
0.0000 [IU] | SUBCUTANEOUS | Status: DC
  Administered 2019-11-14 (×4): 5 [IU] via SUBCUTANEOUS
  Administered 2019-11-14: 3 [IU] via SUBCUTANEOUS
  Administered 2019-11-14: 5 [IU] via SUBCUTANEOUS
  Administered 2019-11-15: 3 [IU] via SUBCUTANEOUS
  Administered 2019-11-15: 2 [IU] via SUBCUTANEOUS
  Administered 2019-11-15: 5 [IU] via SUBCUTANEOUS
  Administered 2019-11-15 (×2): 3 [IU] via SUBCUTANEOUS
  Administered 2019-11-15 – 2019-11-16 (×2): 5 [IU] via SUBCUTANEOUS
  Administered 2019-11-16: 2 [IU] via SUBCUTANEOUS

## 2019-11-14 MED ORDER — FENTANYL CITRATE (PF) 100 MCG/2ML IJ SOLN
INTRAMUSCULAR | Status: AC
  Filled 2019-11-14: qty 2

## 2019-11-14 MED ORDER — ROSUVASTATIN CALCIUM 10 MG PO TABS
10.0000 mg | ORAL_TABLET | Freq: Every day | ORAL | Status: DC
  Administered 2019-11-15 – 2019-11-16 (×2): 10 mg via ORAL
  Filled 2019-11-14 (×2): qty 1

## 2019-11-14 MED ORDER — INSULIN ASPART 100 UNIT/ML ~~LOC~~ SOLN: 0.0000 [IU] | SUBCUTANEOUS | Status: DC

## 2019-11-14 MED ORDER — METOCLOPRAMIDE HCL 5 MG/ML IJ SOLN: 5.0000 mg | Freq: Four times a day (QID) | INTRAMUSCULAR | Status: DC | PRN

## 2019-11-14 MED ORDER — DIPHENHYDRAMINE HCL 12.5 MG/5ML PO ELIX: 12.5000 mg | ORAL_SOLUTION | Freq: Four times a day (QID) | ORAL | Status: DC | PRN

## 2019-11-14 MED ORDER — CYCLOBENZAPRINE HCL 10 MG PO TABS: 5.0000 mg | ORAL_TABLET | Freq: Three times a day (TID) | ORAL | Status: DC | PRN

## 2019-11-14 MED ORDER — GABAPENTIN 300 MG PO CAPS
300.0000 mg | ORAL_CAPSULE | Freq: Two times a day (BID) | ORAL | Status: DC
  Administered 2019-11-16: 300 mg via ORAL
  Filled 2019-11-14 (×2): qty 1

## 2019-11-14 MED ORDER — MEPERIDINE HCL 50 MG/ML IJ SOLN: 6.2500 mg | INTRAMUSCULAR | Status: DC | PRN

## 2019-11-14 MED ORDER — ONDANSETRON HCL 4 MG PO TABS: 4.0000 mg | ORAL_TABLET | Freq: Three times a day (TID) | ORAL | Status: DC | PRN

## 2019-11-14 MED ORDER — MIDAZOLAM HCL 2 MG/2ML IJ SOLN
INTRAMUSCULAR | Status: DC | PRN
  Administered 2019-11-14: 2 mg via INTRAVENOUS

## 2019-11-14 SURGICAL SUPPLY — 13 items
BAG URO CATCHER STRL LF (MISCELLANEOUS) ×3 IMPLANT
CATH INTERMIT  6FR 70CM (CATHETERS) ×3 IMPLANT
CLOTH BEACON ORANGE TIMEOUT ST (SAFETY) ×3 IMPLANT
GLOVE BIOGEL M STRL SZ7.5 (GLOVE) ×3 IMPLANT
GOWN STRL REUS W/TWL LRG LVL3 (GOWN DISPOSABLE) ×6 IMPLANT
GUIDEWIRE STR DUAL SENSOR (WIRE) ×3 IMPLANT
KIT TURNOVER KIT A (KITS) IMPLANT
MANIFOLD NEPTUNE II (INSTRUMENTS) ×3 IMPLANT
PACK CYSTO (CUSTOM PROCEDURE TRAY) ×3 IMPLANT
STENT URET 6FRX24 CONTOUR (STENTS) ×2 IMPLANT
TUBING CONNECTING 10 (TUBING) ×2 IMPLANT
TUBING CONNECTING 10' (TUBING) ×1
TUBING UROLOGY SET (TUBING) IMPLANT

## 2019-11-14 NOTE — Transfer of Care (Signed)
Immediate Anesthesia Transfer of Care Note  Patient: Isabella Galloway  Procedure(s) Performed: Procedure(s): CYSTOSCOPY AND LEFT URETERAL WITH STENT PLACEMENT retrograde pylegram (Left)  Patient Location: PACU  Anesthesia Type:General  Level of Consciousness: Alert, Awake, Oriented  Airway & Oxygen Therapy: Patient Spontanous Breathing  Post-op Assessment: Report given to RN  Post vital signs: Reviewed and stable  Last Vitals:  Vitals:   11/14/19 0527 11/14/19 1010  BP: 138/76   Pulse: (!) 107 (!) 113  Resp: 18   Temp: 36.7 C   SpO2: 10%     Complications: No apparent anesthesia complications

## 2019-11-14 NOTE — Op Note (Signed)
Preoperative diagnosis:  1. Left ureteral stone and fever   Postoperative diagnosis:  1. Left ureteral stone and fever   Procedure:  1. Cystoscopy 2. Left ureteral stent placement (6 x 24 - no string) 3. Left retrograde pyelography with interpretation  Surgeon: Pryor Curia. M.D.  Anesthesia: General  Complications: None  Intraoperative findings: Left retrograde pyelography was performed with a 6 French ureteral catheter and Omnipaque contrast.  This demonstrated a normal caliber ureter with a filling defect noted in the proximal left ureter consistent with the patient's stone.  There was noted to be dilation with hydronephrosis above the level of the stent without other abnormalities.  EBL: Minimal  Specimens: None  Indication: Isabella Galloway is a 54 y.o. patient with left ureteral obstruction due to a 4 mm left ureteral stone and fever. After reviewing the management options for treatment, he elected to proceed with the above surgical procedure(s). We have discussed the potential benefits and risks of the procedure, side effects of the proposed treatment, the likelihood of the patient achieving the goals of the procedure, and any potential problems that might occur during the procedure or recuperation. Informed consent has been obtained.  Description of procedure:  The patient was taken to the operating room and general anesthesia was induced.  The patient was placed in the dorsal lithotomy position, prepped and draped in the usual sterile fashion, and preoperative antibiotics were administered. A preoperative time-out was performed.   Cystourethroscopy was performed.  The patient's urethra was examined and was unremarkable. The bladder was then systematically examined in its entirety. There was no evidence for any bladder tumors, stones, or other mucosal pathology.    Attention then turned to the left ureteral orifice and a 6 French ureteral catheter was used to intubate  the left ureteral orifice.  Omnipaque contrast was injected with findings as dictated above.  A 0.38 sensor guidewire was then advanced up the left ureter into the renal pelvis under fluoroscopic guidance.  The wire was then backloaded through the cystoscope and a ureteral stent was advance over the wire using Seldinger technique.  The stent was positioned appropriately under fluoroscopic and cystoscopic guidance.  The wire was then removed with an adequate stent curl noted in the renal pelvis as well as in the bladder.  The bladder was then emptied and the procedure ended.  The patient appeared to tolerate the procedure well and without complications.  The patient was able to be awakened and transferred to the recovery unit in satisfactory condition.    Pryor Curia MD

## 2019-11-14 NOTE — ED Notes (Signed)
[facility] called to transport pt to [facility]

## 2019-11-14 NOTE — Anesthesia Preprocedure Evaluation (Signed)
Anesthesia Evaluation  Patient identified by MRN, date of birth, ID band Patient awake    Reviewed: Allergy & Precautions, NPO status , Patient's Chart, lab work & pertinent test results  Airway Mallampati: II  TM Distance: <3 FB Neck ROM: Full    Dental no notable dental hx. (+) Teeth Intact, Dental Advisory Given   Pulmonary sleep apnea ,    Pulmonary exam normal breath sounds clear to auscultation       Cardiovascular + CAD  Normal cardiovascular exam Rhythm:Regular Rate:Normal  11/14/19 EKG ST r 118   12/05/2016 Myo View  Nuclear stress EF: 65%.  The left ventricular ejection fraction is normal (55-65%).  There was no ST segment deviation noted during stress.  The study is normal.  This is a low risk study.      Neuro/Psych Anxiety negative neurological ROS     GI/Hepatic Neg liver ROS, GERD  Medicated,  Endo/Other  diabetes, Type 2, Oral Hypoglycemic Agents  Renal/GU Lab Results      Component                Value               Date                      CREATININE               0.83                11/13/2019                BUN                      12                  11/13/2019                NA                       134 (L)             11/13/2019                K                        4.0                 11/13/2019                CL                       99                  11/13/2019                CO2                      24                  11/13/2019                Musculoskeletal   Abdominal (+) + obese,   Peds  Hematology Lab Results      Component                Value  Date                      WBC                      15.4 (H)            11/13/2019                HGB                      14.3                11/13/2019                HCT                      44.0                11/13/2019                MCV                      94.2                11/13/2019                PLT                       220                 11/13/2019              Anesthesia Other Findings   Reproductive/Obstetrics                            Anesthesia Physical Anesthesia Plan  ASA: III and emergent  Anesthesia Plan: General   Post-op Pain Management:    Induction: Intravenous  PONV Risk Score and Plan: 4 or greater and Treatment may vary due to age or medical condition, Ondansetron and Dexamethasone  Airway Management Planned: LMA  Additional Equipment: None  Intra-op Plan:   Post-operative Plan:   Informed Consent: I have reviewed the patients History and Physical, chart, labs and discussed the procedure including the risks, benefits and alternatives for the proposed anesthesia with the patient or authorized representative who has indicated his/her understanding and acceptance.     Dental advisory given  Plan Discussed with: CRNA  Anesthesia Plan Comments: (GA W LMA)       Anesthesia Quick Evaluation

## 2019-11-14 NOTE — Plan of Care (Signed)
Initiated care plan 

## 2019-11-14 NOTE — Anesthesia Procedure Notes (Signed)
Procedure Name: LMA Insertion Date/Time: 11/14/2019 9:39 AM Performed by: Gerald Leitz, CRNA Pre-anesthesia Checklist: Patient identified, Patient being monitored, Timeout performed, Emergency Drugs available and Suction available Patient Re-evaluated:Patient Re-evaluated prior to induction Oxygen Delivery Method: Circle system utilized Preoxygenation: Pre-oxygenation with 100% oxygen Induction Type: IV induction Ventilation: Mask ventilation without difficulty LMA: LMA with gastric port inserted LMA Size: 4.0 Tube type: Oral Number of attempts: 1 Placement Confirmation: positive ETCO2 and breath sounds checked- equal and bilateral Tube secured with: Tape Dental Injury: Teeth and Oropharynx as per pre-operative assessment

## 2019-11-14 NOTE — H&P (Signed)
H&P  Chief Complaint: Left ureteral stone and fever  History of Present Illness: Isabella Galloway is a 54 y.o. year old who has a known 4 mm left ureteral stone diagnosed a couple of weeks ago by her PCP.  She has been on medical expulsion therapy.  Over the last 24 hrs, she developed worsening left flank pain, nausea/vomiting, and subjective fever and chills.  She presented to the ED tonight.  Her UA was not particularly suspicious but no other source of infection was identified and her fever increased to 102 before resolving back to 99.  She has been hemodynamically stable.   Past Medical History:  Diagnosis Date  . Anxiety   . Depression   . Diabetes mellitus without complication ([place])   . Family history of breast cancer   . Genetic testing 04/11/2017   Multi-Cancer panel (83 genes) @ [facility] - No pathogenic mutations detected  . GERD (gastroesophageal reflux disease)   . History of kidney stones    passed stone - no surgery  . Hyperlipidemia   . Peripheral vascular disease ([place])   . Sleep apnea    Does not use CPAP  . Treadmill stress test negative for angina pectoris    NORMAL PER PATIENT    Past Surgical History:  Procedure Laterality Date  . BREAST SURGERY Left    benign with metal clip  . COLONOSCOPY     polyp  . LAPAROSCOPIC BILATERAL SALPINGO OOPHERECTOMY Bilateral 03/06/2017   Procedure: LAPAROSCOPIC Left Salpingectomy & OOPHORECTOMY, Right Oophorectomy;  Surgeon: Janyth Pupa, DO;  Location: [facility];  Service: Gynecology;  Laterality: Bilateral;  . PILONIDAL CYST DRAINAGE  1980  . tailbone fx & subsequent cyst with excision    . UPPER GI ENDOSCOPY    . WISDOM TOOTH EXTRACTION      Home Medications:  No current facility-administered medications on file prior to encounter.   Current Outpatient Medications on File Prior to Encounter  Medication Sig Dispense Refill  . acetaminophen (TYLENOL) 325 MG tablet Take 2 tablets (650 mg total) by mouth every 6 (six)  hours as needed. (Patient taking differently: Take 650 mg by mouth every 6 (six) hours as needed for moderate pain. ) 30 tablet 0  . bifidobacterium infantis (ALIGN) capsule Take 1 capsule by mouth daily.    . cyclobenzaprine (FLEXERIL) 5 MG tablet Take 5-10 mg by mouth 3 (three) times daily as needed for muscle spasms.    Marland Kitchen docusate sodium (COLACE) 100 MG capsule Take 1 capsule (100 mg total) by mouth 2 (two) times daily. (Patient taking differently: Take 100 mg by mouth daily as needed for mild constipation. ) 30 capsule 0  . escitalopram (LEXAPRO) 20 MG tablet Take 20 mg by mouth daily.    Marland Kitchen gabapentin (NEURONTIN) 300 MG capsule Take 300-600 mg by mouth 3 (three) times daily. Take 1 capsule (300 mg) BID Then Take 2 capsules (600 mg) at bedtime    . HYDROcodone-acetaminophen (NORCO/VICODIN) 5-325 MG tablet Take 1 tablet by mouth every 6 (six) hours as needed for moderate pain or severe pain.     . Melatonin 5 MG CHEW Chew 1 tablet by mouth at bedtime as needed (sleep).     . meloxicam (MOBIC) 15 MG tablet Take 15 mg by mouth daily as needed for pain.     . metFORMIN (GLUCOPHAGE) 500 MG tablet Take 1,000 mg by mouth 2 (two) times daily with a meal.     . omeprazole (PRILOSEC) 40 MG capsule Take  40 mg by mouth daily as needed (indigestion).     . ondansetron (ZOFRAN) 4 MG tablet Take 4 mg by mouth every 8 (eight) hours as needed for nausea or vomiting.     Marland Kitchen OZEMPIC, 1 MG/DOSE, 4 MG/3ML SOPN Inject 1 mg into the skin once a week.     Vladimir Faster Glyc-Propyl Glyc PF (SYSTANE PRESERVATIVE FREE) 0.4-0.3 % SOLN Apply 1-2 drops to eye 2 (two) times daily as needed (dryness).    . repaglinide (PRANDIN) 1 MG tablet Take 1 mg by mouth daily as needed (blood sugar >125).     . rosuvastatin (CRESTOR) 10 MG tablet Take 1 tablet (10 mg total) by mouth daily. NEED OV. 90 tablet 0  . tamsulosin (FLOMAX) 0.4 MG CAPS capsule Take 0.4 mg by mouth daily.    . traZODone (DESYREL) 50 MG tablet Take 50 mg by mouth at  bedtime as needed for sleep.     . Vitamin D, Ergocalciferol, (DRISDOL) 1.25 MG (50000 UNIT) CAPS capsule Take 50,000 Units by mouth every 14 (fourteen) days.    Marland Kitchen BYDUREON 2 MG PEN Inject 2 mg into the skin every 7 (seven) days.  (Patient not taking: Reported on 11/13/2019)  11  . escitalopram (LEXAPRO) 10 MG tablet Take 10 mg by mouth daily. (Patient not taking: Reported on 11/13/2019)    . gabapentin (NEURONTIN) 250 MG/5ML solution Take by mouth. (Patient not taking: Reported on 11/13/2019)    . ibuprofen (ADVIL,MOTRIN) 600 MG tablet Take 1 tablet (600 mg total) by mouth every 6 (six) hours as needed. (Patient not taking: Reported on 11/13/2019) 30 tablet 0  . oxycodone (OXY-IR) 5 MG capsule Take 1 capsule (5 mg total) by mouth every 6 (six) hours as needed. (Patient not taking: Reported on 11/13/2019) 20 capsule 0     Allergies: No Known Allergies  Family History  Problem Relation Age of Onset  . Heart failure Mother   . COPD Mother   . Cancer Mother        lung cancer  . Other Mother        pituitary tumor  . Breast cancer Maternal Grandmother 104       deceased 12  . Heart attack Maternal Grandfather   . Cancer Other        mat granmother's sisters: one with leukemia and 2 with kidney ca at older ages    Social History:  reports that she has never smoked. She has never used smokeless tobacco. She reports that she does not drink alcohol and does not use drugs.  ROS: A complete review of systems was performed.  All systems are negative except for pertinent findings as noted.  Physical Exam:  Vital signs in last 24 hours: Temp:  [99 F (37.2 C)-102.9 F (39.4 C)] 99 F (37.2 C) (07/02 2314) Pulse Rate:  [106-126] 106 (07/03 0046) Resp:  [16-28] 18 (07/03 0046) BP: (102-131)/(70-101) 115/101 (07/03 0046) SpO2:  [92 %-98 %] 96 % (07/03 0046) Weight:  [86.6 kg] 86.6 kg (07/02 1849) Constitutional:  Alert and oriented, No acute distress Cardiovascular: Regular rate and rhythm, No  JVD Respiratory: Normal respiratory effort, Lungs clear bilaterally GI: Tender across upper abdomen bilaterally but without rebound tenderness or guarding. GU: Moderate left CVAT. Lymphatic: No lymphadenopathy Neurologic: Grossly intact, no focal deficits Psychiatric: Normal mood and affect  Laboratory Data:  Recent Labs    11/13/19 1902  WBC 15.4*  HGB 14.3  HCT 44.0  PLT 220  Recent Labs    11/13/19 1902  NA 134*  K 4.0  CL 99  GLUCOSE 258*  BUN 12  CALCIUM 9.0  CREATININE 0.83     Results for orders placed or performed during the hospital encounter of 11/13/19 (from the past 24 hour(s))  CBG monitoring, ED     Status: Abnormal   Collection Time: 11/13/19  6:53 PM  Result Value Ref Range   Glucose-Capillary 243 (H) 70 - 99 mg/dL  Comprehensive metabolic panel     Status: Abnormal   Collection Time: 11/13/19  7:02 PM  Result Value Ref Range   Sodium 134 (L) 135 - 145 mmol/L   Potassium 4.0 3.5 - 5.1 mmol/L   Chloride 99 98 - 111 mmol/L   CO2 24 22 - 32 mmol/L   Glucose, Bld 258 (H) 70 - 99 mg/dL   BUN 12 6 - 20 mg/dL   Creatinine, Ser 0.83 0.44 - 1.00 mg/dL   Calcium 9.0 8.9 - 10.3 mg/dL   Total Protein 6.9 6.5 - 8.1 g/dL   Albumin 3.6 3.5 - 5.0 g/dL   AST 27 15 - 41 U/L   ALT 38 0 - 44 U/L   Alkaline Phosphatase 55 38 - 126 U/L   Total Bilirubin 0.9 0.3 - 1.2 mg/dL   GFR calc non Af Amer >60 >60 mL/min   GFR calc Af Amer >60 >60 mL/min   Anion gap 11 5 - 15  CBC     Status: Abnormal   Collection Time: 11/13/19  7:02 PM  Result Value Ref Range   WBC 15.4 (H) 4.0 - 10.5 K/uL   RBC 4.67 3.87 - 5.11 MIL/uL   Hemoglobin 14.3 12.0 - 15.0 g/dL   HCT 44.0 36 - 46 %   MCV 94.2 80.0 - 100.0 fL   MCH 30.6 26.0 - 34.0 pg   MCHC 32.5 30.0 - 36.0 g/dL   RDW 13.1 11.5 - 15.5 %   Platelets 220 150 - 400 K/uL   nRBC 0.0 0.0 - 0.2 %  Lipase, blood     Status: None   Collection Time: 11/13/19  7:02 PM  Result Value Ref Range   Lipase 27 11 - 51 U/L  Lactic  acid, plasma     Status: None   Collection Time: 11/13/19  7:02 PM  Result Value Ref Range   Lactic Acid, Venous 1.8 0.5 - 1.9 mmol/L  Urinalysis, Complete w Microscopic     Status: Abnormal   Collection Time: 11/13/19  7:43 PM  Result Value Ref Range   Color, Urine YELLOW YELLOW   APPearance HAZY (A) CLEAR   Specific Gravity, Urine 1.018 1.005 - 1.030   pH 6.0 5.0 - 8.0   Glucose, UA 50 (A) NEGATIVE mg/dL   Hgb urine dipstick SMALL (A) NEGATIVE   Bilirubin Urine NEGATIVE NEGATIVE   Ketones, ur 20 (A) NEGATIVE mg/dL   Protein, ur 30 (A) NEGATIVE mg/dL   Nitrite NEGATIVE NEGATIVE   Leukocytes,Ua SMALL (A) NEGATIVE   RBC / HPF 0-5 0 - 5 RBC/hpf   WBC, UA 21-50 0 - 5 WBC/hpf   Bacteria, UA RARE (A) NONE SEEN   Squamous Epithelial / LPF 11-20 0 - 5   Mucus PRESENT    Hyaline Casts, UA PRESENT    Non Squamous Epithelial 0-5 (A) NONE SEEN  SARS Coronavirus 2 by RT PCR (hospital order, performed in [place] hospital lab) Nasopharyngeal Nasopharyngeal Swab     Status: None  Collection Time: 11/13/19  9:26 PM   Specimen: Nasopharyngeal Swab  Result Value Ref Range   SARS Coronavirus 2 NEGATIVE NEGATIVE   Recent Results (from the past 240 hour(s))  SARS Coronavirus 2 by RT PCR (hospital order, performed in [facility] hospital lab) Nasopharyngeal Nasopharyngeal Swab     Status: None   Collection Time: 11/13/19  9:26 PM   Specimen: Nasopharyngeal Swab  Result Value Ref Range Status   SARS Coronavirus 2 NEGATIVE NEGATIVE Final    Comment: (NOTE) SARS-CoV-2 target nucleic acids are NOT DETECTED.  The SARS-CoV-2 RNA is generally detectable in upper and lower respiratory specimens during the acute phase of infection. The lowest concentration of SARS-CoV-2 viral copies this assay can detect is 250 copies / mL. A negative result does not preclude SARS-CoV-2 infection and should not be used as the sole basis for treatment or other patient management decisions.  A negative result may  occur with improper specimen collection / handling, submission of specimen other than nasopharyngeal swab, presence of viral mutation(s) within the areas targeted by this assay, and inadequate number of viral copies (<250 copies / mL). A negative result must be combined with clinical observations, patient history, and epidemiological information.  Fact Sheet for Patients:   StrictlyIdeas.no  Fact Sheet for Healthcare Providers: BankingDealers.co.za  This test is not yet approved or  cleared by the [place] FDA and has been authorized for detection and/or diagnosis of SARS-CoV-2 by FDA under an Emergency Use Authorization (EUA).  This EUA will remain in effect (meaning this test can be used) for the duration of the COVID-19 declaration under Section 564(b)(1) of the Act, 21 U.S.C. section 360bbb-3(b)(1), unless the authorization is terminated or revoked sooner.  Performed at [facility], [place] [address] [place], [place] [address]     Renal Function: Recent Labs    11/13/19 1902  CREATININE 0.83   Estimated Creatinine Clearance: 78.2 mL/min (by C-G formula based on SCr of 0.83 mg/dL).  Radiologic Imaging: CT ABDOMEN PELVIS W CONTRAST  Result Date: 11/13/2019 CLINICAL DATA:  History of kidney stones, fever and dysuria EXAM: CT ABDOMEN AND PELVIS WITH CONTRAST TECHNIQUE: Multidetector CT imaging of the abdomen and pelvis was performed using the standard protocol following bolus administration of intravenous contrast. CONTRAST:  183mL OMNIPAQUE IOHEXOL 300 MG/ML  SOLN COMPARISON:  Ultrasound 11/13/2019, CT 10/22/2019 FINDINGS: Lower chest: Lung bases demonstrate hazy dependent atelectasis. No acute consolidation or pleural effusion. Hepatobiliary: No calcified gallstone.  No biliary dilatation. Pancreas: Unremarkable. No pancreatic ductal dilatation or surrounding inflammatory changes. Spleen: Normal in size without focal  abnormality. Adrenals/Urinary Tract: Stable 10 mm right adrenal nodule. Normal left adrenal gland. Similar mild left hydronephrosis and proximal hydroureter, secondary to a 3 x 4 mm stone in the proximal left ureter, without significant distal migration. The urinary bladder shows subtle foci of hyperdense density, possibly representing early excreted contrast mixed with urine. Stomach/Bowel: Stomach is within normal limits. Appendix appears normal. No evidence of bowel wall thickening, distention, or inflammatory changes. Vascular/Lymphatic: Mild aortic atherosclerosis. No aneurysm. No suspicious nodes Reproductive: Probable fibroid in the left lower uterine segment. No adnexal mass. Other: Negative for free air or free fluid. Musculoskeletal: No acute or significant osseous findings. IMPRESSION: 1. No significant change in mild left hydronephrosis and proximal hydroureter secondary to a 3 x 4 mm stone in the proximal left ureter. This is without significant distal migration since comparison CT from 10/22/2019. 2. Probable uterine fibroid. Electronically Signed   By:  Donavan Foil M.D.   On: 11/13/2019 23:20   DG Chest Port 1 View  Result Date: 11/13/2019 CLINICAL DATA:  Right-sided flank pain. EXAM: PORTABLE CHEST 1 VIEW COMPARISON:  None. FINDINGS: There is no evidence of acute infiltrate, pleural effusion or pneumothorax. The heart size and mediastinal contours are within normal limits. The visualized skeletal structures are unremarkable. IMPRESSION: No active disease. Electronically Signed   By: Virgina Norfolk M.D.   On: 11/13/2019 20:56   US Abdomen Limited RUQ  Result Date: 11/13/2019 CLINICAL DATA:  Fever and right-sided pain with nausea and vomiting. EXAM: ULTRASOUND ABDOMEN LIMITED RIGHT UPPER QUADRANT COMPARISON:  None. FINDINGS: Gallbladder: No gallstones or wall thickening visualized (1.9 mm). No sonographic Murphy sign noted by sonographer. Common bile duct: Diameter: 6.1 mm Liver: No focal  lesion identified. Diffusely increased echogenicity of the liver parenchyma is noted. Portal vein is patent on color Doppler imaging with normal direction of blood flow towards the liver. Other: None. IMPRESSION: Fatty liver. Electronically Signed   By: Virgina Norfolk M.D.   On: 11/13/2019 22:13    Impression/Assessment:  Left ureteral stone with fever  Plan:  Although her UA is not very suspicious, she has no other clear etiology for fever.  Currently, she is afebrile and hemodynamically stable.  She will transferred to [facility] with plans to proceed with cystoscopy and left ureteral stent placement in the morning.  If she develops worsening fever or instability, will proceed sooner tonight.  Continue broad spectrum IV antibiotics.  Urine culture ordered.  Dutch Gray 11/14/2019, 12:55 AM  Pryor Curia MD

## 2019-11-14 NOTE — Anesthesia Postprocedure Evaluation (Signed)
Anesthesia Post Note  Patient: Isabella Galloway  Procedure(s) Performed: CYSTOSCOPY AND LEFT URETERAL WITH STENT PLACEMENT retrograde pylegram (Left Ureter)     Patient location during evaluation: PACU Anesthesia Type: General Level of consciousness: awake and alert Pain management: pain level controlled Vital Signs Assessment: post-procedure vital signs reviewed and stable Respiratory status: spontaneous breathing, nonlabored ventilation, respiratory function stable and patient connected to nasal cannula oxygen Cardiovascular status: blood pressure returned to baseline and stable Postop Assessment: no apparent nausea or vomiting Anesthetic complications: no   No complications documented.  Last Vitals:  Vitals:   11/14/19 1045 11/14/19 1100  BP: (!) 156/82 (!) 153/85  Pulse: (!) 106 (!) 103  Resp: 16 20  Temp:  36.7 C  SpO2: 96% 96%    Last Pain:  Vitals:   11/14/19 1100  TempSrc:   PainSc: 0-No pain                 Barnet Glasgow

## 2019-11-14 NOTE — Progress Notes (Signed)
Patient voided 400cc's of pink clear urine after procedure.

## 2019-11-14 NOTE — ED Notes (Signed)
[facility] at Bedside; Report given

## 2019-11-15 ENCOUNTER — Encounter (HOSPITAL_COMMUNITY): Payer: Self-pay | Admitting: Urology

## 2019-11-15 LAB — GLUCOSE, CAPILLARY
Glucose-Capillary: 146 mg/dL — ABNORMAL HIGH (ref 70–99)
Glucose-Capillary: 158 mg/dL — ABNORMAL HIGH (ref 70–99)
Glucose-Capillary: 178 mg/dL — ABNORMAL HIGH (ref 70–99)
Glucose-Capillary: 185 mg/dL — ABNORMAL HIGH (ref 70–99)
Glucose-Capillary: 221 mg/dL — ABNORMAL HIGH (ref 70–99)
Glucose-Capillary: 223 mg/dL — ABNORMAL HIGH (ref 70–99)

## 2019-11-15 LAB — CBC
HCT: 37.1 % (ref 36.0–46.0)
Hemoglobin: 12.8 g/dL (ref 12.0–15.0)
MCH: 30.8 pg (ref 26.0–34.0)
MCHC: 34.5 g/dL (ref 30.0–36.0)
MCV: 89.4 fL (ref 80.0–100.0)
Platelets: 198 10*3/uL (ref 150–400)
RBC: 4.15 MIL/uL (ref 3.87–5.11)
RDW: 12.6 % (ref 11.5–15.5)
WBC: 10.8 10*3/uL — ABNORMAL HIGH (ref 4.0–10.5)
nRBC: 0 % (ref 0.0–0.2)

## 2019-11-15 LAB — URINE CULTURE: Culture: NO GROWTH

## 2019-11-15 MED ORDER — HYDROCODONE-ACETAMINOPHEN 5-325 MG PO TABS
1.0000 | ORAL_TABLET | Freq: Four times a day (QID) | ORAL | Status: DC | PRN
  Administered 2019-11-15: 1 via ORAL
  Filled 2019-11-15: qty 1

## 2019-11-15 MED ORDER — POLYETHYLENE GLYCOL 3350 17 G PO PACK
17.0000 g | PACK | Freq: Every day | ORAL | Status: DC
  Administered 2019-11-15 – 2019-11-16 (×2): 17 g via ORAL
  Filled 2019-11-15 (×2): qty 1

## 2019-11-15 NOTE — Progress Notes (Signed)
1 Day Post-Op  Subjective: POD#1 from Cystoscopy with left stent for a 76mm left proximal ureteral stone.   She continues to have some nausea and pain.  The pain is primarily lower abdominal with some right flank pain.  She has been constipated but has had 2 small bowel movements.  She had another chill overnight but has had no fever.  The culture from the OR is pending.  Blood cultures negative so far.   ROS:  Review of Systems  All other systems reviewed and are negative.   Anti-infectives: Anti-infectives (From admission, onward)   Start     Dose/Rate Route Frequency Ordered Stop   11/14/19 0400  piperacillin-tazobactam (ZOSYN) IVPB 3.375 g  Status:  Discontinued       "Followed by" Linked Group Details   3.375 g 12.5 mL/hr over 240 Minutes Intravenous Every 8 hours 11/13/19 2137 11/14/19 0230   11/14/19 0100  cefTRIAXone (ROCEPHIN) 1 g in sodium chloride 0.9 % 100 mL IVPB     Discontinue     1 g 200 mL/hr over 30 Minutes Intravenous Every 24 hours 11/14/19 0053     11/13/19 2145  piperacillin-tazobactam (ZOSYN) IVPB 3.375 g       "Followed by" Linked Group Details   3.375 g 100 mL/hr over 30 Minutes Intravenous  Once 11/13/19 2137 11/13/19 2313      Current Facility-Administered Medications  Medication Dose Route Frequency Provider Last Rate Last Admin  . 0.9 %  sodium chloride infusion   Intravenous Continuous Raynelle Bring, MD 125 mL/hr at 11/15/19 7342 Rate Verify at 11/15/19 8768  . acetaminophen (TYLENOL) tablet 650 mg  650 mg Oral Once Raynelle Bring, MD      . acetaminophen (TYLENOL) tablet 650 mg  650 mg Oral Q4H PRN Raynelle Bring, MD      . acidophilus (RISAQUAD) capsule 1 capsule  1 capsule Oral Daily Raynelle Bring, MD   1 capsule at 11/15/19 0947  . cefTRIAXone (ROCEPHIN) 1 g in sodium chloride 0.9 % 100 mL IVPB  1 g Intravenous Q24H Raynelle Bring, MD   Stopped at 11/14/19 2224  . cyclobenzaprine (FLEXERIL) tablet 5-10 mg  5-10 mg Oral TID PRN Raynelle Bring, MD       . diphenhydrAMINE (BENADRYL) injection 12.5 mg  12.5 mg Intravenous Q6H PRN Raynelle Bring, MD       Or  . diphenhydrAMINE (BENADRYL) 12.5 MG/5ML elixir 12.5 mg  12.5 mg Oral Q6H PRN Raynelle Bring, MD      . docusate sodium (COLACE) capsule 100 mg  100 mg Oral BID Raynelle Bring, MD   100 mg at 11/15/19 0947  . escitalopram (LEXAPRO) tablet 20 mg  20 mg Oral Daily Raynelle Bring, MD   20 mg at 11/15/19 0947  . gabapentin (NEURONTIN) capsule 300 mg  300 mg Oral BID WC Raynelle Bring, MD       And  . gabapentin (NEURONTIN) capsule 600 mg  600 mg Oral QHS Raynelle Bring, MD      . HYDROmorphone (DILAUDID) injection 0.5-1 mg  0.5-1 mg Intravenous Q2H PRN Raynelle Bring, MD   1 mg at 11/14/19 2330  . insulin aspart (novoLOG) injection 0-15 Units  0-15 Units Subcutaneous Q4H Raynelle Bring, MD   2 Units at 11/15/19 [phone]  . metoCLOPramide (REGLAN) injection 5 mg  5 mg Intravenous Q6H PRN Raynelle Bring, MD      . ondansetron [facility]) injection 4 mg  4 mg Intravenous Q4H PRN Raynelle Bring, MD  4 mg at 11/14/19 1821  . ondansetron (ZOFRAN) tablet 4 mg  4 mg Oral Q8H PRN Raynelle Bring, MD      . pantoprazole (PROTONIX) EC tablet 40 mg  40 mg Oral Daily Raynelle Bring, MD   40 mg at 11/15/19 0947  . rosuvastatin (CRESTOR) tablet 10 mg  10 mg Oral Daily Raynelle Bring, MD   10 mg at 11/15/19 0947  . tamsulosin (FLOMAX) capsule 0.4 mg  0.4 mg Oral Daily Raynelle Bring, MD   0.4 mg at 11/15/19 0946  . traZODone (DESYREL) tablet 50 mg  50 mg Oral QHS PRN Raynelle Bring, MD         Objective: Vital signs in last 24 hours: Temp:  [97.6 F (36.4 C)-98.6 F (37 C)] 97.9 F (36.6 C) (07/04 0435) Pulse Rate:  [94-105] 94 (07/04 0435) Resp:  [14-22] 16 (07/04 0435) BP: (132-168)/(74-93) 132/74 (07/04 0435) SpO2:  [95 %-99 %] 96 % (07/04 0435)  Intake/Output from previous day: 07/03 0701 - 07/04 0700 In: 3478 [P.O.:720; I.V.:2658; IV Piggyback:100] Out: 2125 [Urine:2025; Emesis/NG  output:100] Intake/Output this shift: Total I/O In: -  Out: 200 [Urine:200]   Physical Exam Vitals reviewed.  Constitutional:      Appearance: Normal appearance. She is obese.  Abdominal:     Comments: Soft, obese, mildly distended, tender diffusely but greatest in right lower quadrant and flank.     Neurological:     Mental Status: She is alert.     Lab Results:  Recent Labs    11/13/19 1902 11/15/19 0554  WBC 15.4* 10.8*  HGB 14.3 12.8  HCT 44.0 37.1  PLT 220 198   BMET Recent Labs    11/13/19 1902  NA 134*  K 4.0  CL 99  CO2 24  GLUCOSE 258*  BUN 12  CREATININE 0.83  CALCIUM 9.0   PT/INR No results for input(s): LABPROT, INR in the last 72 hours. ABG No results for input(s): PHART, HCO3 in the last 72 hours.  Invalid input(s): PCO2, PO2  Studies/Results: CT ABDOMEN PELVIS W CONTRAST  Result Date: 11/13/2019 CLINICAL DATA:  History of kidney stones, fever and dysuria EXAM: CT ABDOMEN AND PELVIS WITH CONTRAST TECHNIQUE: Multidetector CT imaging of the abdomen and pelvis was performed using the standard protocol following bolus administration of intravenous contrast. CONTRAST:  150mL OMNIPAQUE IOHEXOL 300 MG/ML  SOLN COMPARISON:  Ultrasound 11/13/2019, CT 10/22/2019 FINDINGS: Lower chest: Lung bases demonstrate hazy dependent atelectasis. No acute consolidation or pleural effusion. Hepatobiliary: No calcified gallstone.  No biliary dilatation. Pancreas: Unremarkable. No pancreatic ductal dilatation or surrounding inflammatory changes. Spleen: Normal in size without focal abnormality. Adrenals/Urinary Tract: Stable 10 mm right adrenal nodule. Normal left adrenal gland. Similar mild left hydronephrosis and proximal hydroureter, secondary to a 3 x 4 mm stone in the proximal left ureter, without significant distal migration. The urinary bladder shows subtle foci of hyperdense density, possibly representing early excreted contrast mixed with urine. Stomach/Bowel: Stomach  is within normal limits. Appendix appears normal. No evidence of bowel wall thickening, distention, or inflammatory changes. Vascular/Lymphatic: Mild aortic atherosclerosis. No aneurysm. No suspicious nodes Reproductive: Probable fibroid in the left lower uterine segment. No adnexal mass. Other: Negative for free air or free fluid. Musculoskeletal: No acute or significant osseous findings. IMPRESSION: 1. No significant change in mild left hydronephrosis and proximal hydroureter secondary to a 3 x 4 mm stone in the proximal left ureter. This is without significant distal migration since comparison CT from 10/22/2019. 2. Probable  uterine fibroid. Electronically Signed   By: Donavan Foil M.D.   On: 11/13/2019 23:20   DG Chest Port 1 View  Result Date: 11/13/2019 CLINICAL DATA:  Right-sided flank pain. EXAM: PORTABLE CHEST 1 VIEW COMPARISON:  None. FINDINGS: There is no evidence of acute infiltrate, pleural effusion or pneumothorax. The heart size and mediastinal contours are within normal limits. The visualized skeletal structures are unremarkable. IMPRESSION: No active disease. Electronically Signed   By: Virgina Norfolk M.D.   On: 11/13/2019 20:56   DG C-Arm 1-60 Min-No Report  Result Date: 11/14/2019 Fluoroscopy was utilized by the requesting physician.  No radiographic interpretation.   US Abdomen Limited RUQ  Result Date: 11/13/2019 CLINICAL DATA:  Fever and right-sided pain with nausea and vomiting. EXAM: ULTRASOUND ABDOMEN LIMITED RIGHT UPPER QUADRANT COMPARISON:  None. FINDINGS: Gallbladder: No gallstones or wall thickening visualized (1.9 mm). No sonographic Murphy sign noted by sonographer. Common bile duct: Diameter: 6.1 mm Liver: No focal lesion identified. Diffusely increased echogenicity of the liver parenchyma is noted. Portal vein is patent on color Doppler imaging with normal direction of blood flow towards the liver. Other: None. IMPRESSION: Fatty liver. Electronically Signed   By:  Virgina Norfolk M.D.   On: 11/13/2019 22:13     Assessment and Plan: - left ureteral stone with possible infection POD#1 from stent insertion.  Still has pain. Culture pending.  Will reevaluated for discharge in the morning. - right flank and abdominal pain.  No obvious cause determined.  - Constipation.   Miralax ordered.       LOS: 1 day    Irine Seal 11/15/2019 [phone] ID: Isabella Galloway, female   DOB: 1965/09/02, 54 y.o.   MRN: [id number]

## 2019-11-16 ENCOUNTER — Other Ambulatory Visit: Payer: Self-pay | Admitting: Urology

## 2019-11-16 DIAGNOSIS — R609 Edema, unspecified: Secondary | ICD-10-CM | POA: Diagnosis not present

## 2019-11-16 DIAGNOSIS — N23 Unspecified renal colic: Secondary | ICD-10-CM

## 2019-11-16 DIAGNOSIS — A419 Sepsis, unspecified organism: Secondary | ICD-10-CM

## 2019-11-16 LAB — GLUCOSE, CAPILLARY
Glucose-Capillary: 143 mg/dL — ABNORMAL HIGH (ref 70–99)
Glucose-Capillary: 138 mg/dL — ABNORMAL HIGH (ref 70–99)
Glucose-Capillary: 96 mg/dL (ref 70–99)
Glucose-Capillary: 202 mg/dL — ABNORMAL HIGH (ref 70–99)

## 2019-11-16 MED ORDER — CEFDINIR 300 MG PO CAPS: 300.0000 mg | ORAL_CAPSULE | Freq: Two times a day (BID) | ORAL | 0 refills | Status: DC

## 2019-11-16 MED ORDER — FLUCONAZOLE 150 MG PO TABS: 150.0000 mg | ORAL_TABLET | Freq: Once | ORAL | 1 refills | Status: AC

## 2019-11-16 NOTE — Discharge Instructions (Signed)
Ureteral Stent Implantation, Care After This sheet gives you information about how to care for yourself after your procedure. Your health care provider may also give you more specific instructions. If you have problems or questions, contact your health care provider. What can I expect after the procedure? After the procedure, it is common to have:  Nausea.  Mild pain when you urinate. You may feel this pain in your lower back or lower abdomen. The pain should stop within a few minutes after you urinate. This may last for up to 1 week.  A small amount of blood in your urine for several days. Follow these instructions at home: Medicines  Take over-the-counter and prescription medicines only as told by your health care provider.  If you were prescribed an antibiotic medicine, take it as told by your health care provider. Do not stop taking the antibiotic even if you start to feel better.  Do not drive for 24 hours if you were given a sedative during your procedure.  Ask your health care provider if the medicine prescribed to you requires you to avoid driving or using heavy machinery. Activity  Rest as told by your health care provider.  Avoid sitting for a long time without moving. Get up to take short walks every 1-2 hours. This is important to improve blood flow and breathing. Ask for help if you feel weak or unsteady.  Return to your normal activities as told by your health care provider. Ask your health care provider what activities are safe for you. General instructions   Watch for any blood in your urine. Call your health care provider if the amount of blood in your urine increases.  If you have a catheter: ? Follow instructions from your health care provider about taking care of your catheter and collection bag. ? Do not take baths, swim, or use a hot tub until your health care provider approves. Ask your health care provider if you may take showers. You may only be allowed to  take sponge baths.  Drink enough fluid to keep your urine pale yellow.  Do not use any products that contain nicotine or tobacco, such as cigarettes, e-cigarettes, and chewing tobacco. These can delay healing after surgery. If you need help quitting, ask your health care provider.  Keep all follow-up visits as told by your health care provider. This is important. Contact a health care provider if:  You have pain that gets worse or does not get better with medicine, especially pain when you urinate.  You have difficulty urinating.  You feel nauseous or you vomit repeatedly during a period of more than 2 days after the procedure. Get help right away if:  Your urine is dark red or has blood clots in it.  You are leaking urine (have incontinence).  The end of the stent comes out of your urethra.  You cannot urinate.  You have sudden, sharp, or severe pain in your abdomen or lower back.  You have a fever.  You have swelling or pain in your legs.  You have difficulty breathing. Summary  After the procedure, it is common to have mild pain when you urinate that goes away within a few minutes after you urinate. This may last for up to 1 week.  Watch for any blood in your urine. Call your health care provider if the amount of blood in your urine increases.  Take over-the-counter and prescription medicines only as told by your health care provider.  Drink   enough fluid to keep your urine pale yellow. This information is not intended to replace advice given to you by your health care provider. Make sure you discuss any questions you have with your health care provider. Document Revised: 02/04/2018 Document Reviewed: 02/05/2018 Elsevier Patient Education  [place].  Parotitis  Parotitis means that you have irritation and swelling (inflammation) in one or both of your parotid glands. These glands make saliva. They are found on each side of your face, below and in front of  your earlobes. You may or may not have pain with this condition. What are the causes? This condition may be caused by:  Infections from germs (bacteria or viruses).  Something blocking the flow of saliva through the parotid glands. This can be a stone, scar tissue, or a tumor.  Diseases that cause your body's defense system (immune system) to attack healthy cells in your salivary glands. These are called autoimmune diseases. What increases the risk? You are more likely to get this condition if:  You are 56 years old or older.  You do not drink enough fluids (are dehydrated).  You drink too much alcohol.  You have: ? A dry mouth. ? Diabetes. ? Gout. ? A long-term illness.  You do not take good care of your mouth and teeth (poor dental hygiene).  You have had radiation treatments to the head and neck.  You take certain medicines. What are the signs or symptoms? Symptoms of this condition depend on the cause. They may include:  Swelling under and in front of the ear. This may get worse after you eat.  Redness of the skin over the parotid gland.  Pain and tenderness over the parotid gland. This may get worse after you eat.  Fever or chills.  Pus coming from the ducts inside the mouth.  Dry mouth.  A bad taste in the mouth. How is this treated? Treatment for this condition depends on the cause. Treatment may include:  Antibiotic medicine for an infection from bacteria.  Drinking more fluids.  Removing a stone or obstruction.  Treating a disease that is causing parotitis.  Surgery to drain an infection, remove a growth, or remove the whole gland. Treatment may not be needed if the swelling goes away with home care. Follow these instructions at home: Medicines   Take over-the-counter and prescription medicines only as told by your doctor.  If you were prescribed an antibiotic medicine, take it as told by your doctor. Do not stop taking the antibiotic even if  you start to feel better. Managing pain and swelling  If told, put heat on the affected area. Do this as often as told by your doctor. Use the heat source that your doctor recommends, such as a moist heat pack or a heating pad. ? Place a towel between your skin and the heat source. ? Leave the heat on for 20-30 minutes. ? Remove the heat if your skin turns bright red. This is very important if you are unable to feel pain, heat, or cold. You may have a greater risk of getting burned.  Gargle with salt water 3-4 times a day or as needed. To make salt water, dissolve -1 tsp (3-6 g) of salt in 1 cup (237 mL) of warm water.  Gently rub your parotid glands as told by your doctor. General instructions   Drink enough fluid to keep your pee (urine) pale yellow.  Keep your mouth clean and moist.  Suck on sour candy.  This may help to: ? Make your mouth less dry. ? Make more saliva.  Take good care of your mouth: ? Brush your teeth at least two times a day. ? Floss your teeth every day. ? See your dentist regularly.  Do not use any products that contain nicotine or tobacco. These include cigarettes, e-cigarettes, and chewing tobacco. If you need help quitting, ask your doctor.  Do not drink alcohol.  Keep all follow-up visits as told by your doctor. This is important. Contact a doctor if:  You have a fever or chills.  You have new symptoms.  Your symptoms get worse.  Your symptoms do not get better with treatment. Get help right away if:  You have trouble breathing or swallowing. Summary  Parotitis means that you have irritation and swelling (inflammation) in one or both of your parotid glands.  Symptoms include pain and swelling under and in front of the ear.  Treatment for parotitis depends on the cause. In some cases, the condition may go away on its own with home care.  You should drink plenty of fluids, take good care of your mouth, and avoid tobacco products. This  information is not intended to replace advice given to you by your health care provider. Make sure you discuss any questions you have with your health care provider. Document Revised: 11/26/2017 Document Reviewed: 11/26/2017 Elsevier Patient Education  [place].

## 2019-11-16 NOTE — Discharge Summary (Signed)
Physician Discharge Summary  Patient ID: Isabella Galloway MRN: [id number] DOB/AGE: July 26, 1965 54 y.o.  Admit date: 11/13/2019 Discharge date: 11/16/2019  Admission Diagnoses:  Ureteral stone  Discharge Diagnoses:  Principal Problem:   Ureteral stone Active Problems:   Sepsis without acute organ dysfunction [facility])   Ureteral colic   Parotid swelling   Past Medical History:  Diagnosis Date  . Anxiety   . Depression   . Diabetes mellitus without complication ([place])   . Family history of breast cancer   . Genetic testing 04/11/2017   Multi-Cancer panel (83 genes) @ [facility] - No pathogenic mutations detected  . GERD (gastroesophageal reflux disease)   . History of kidney stones    passed stone - no surgery  . Hyperlipidemia   . Peripheral vascular disease ([place])   . Sleep apnea    Does not use CPAP  . Treadmill stress test negative for angina pectoris    NORMAL PER PATIENT    Surgeries: Procedure(s): CYSTOSCOPY AND LEFT URETERAL WITH STENT PLACEMENT retrograde pylegram on 11/14/2019   Consultants (if any):   Discharged Condition: Improved  Hospital Course: Isabella Galloway is an 54 y.o. female who was admitted 11/13/2019 with a diagnosis of Ureteral stone and went to the operating room on 11/14/2019 and underwent the above named procedures. She improved with the stent and remains afebrile.  She had left parotid swelling this morning but ENT felt that the planned antibiotic with local heat would be sufficient initial therapy with office f/u if progression occurs.      She was given perioperative antibiotics:  Anti-infectives (From admission, onward)   Start     Dose/Rate Route Frequency Ordered Stop   11/16/19 0000  cefdinir (OMNICEF) 300 MG capsule     Discontinue     300 mg Oral 2 times daily 11/16/19 1146     11/14/19 0400  piperacillin-tazobactam (ZOSYN) IVPB 3.375 g  Status:  Discontinued       "Followed by" Linked Group Details   3.375 g 12.5 mL/hr over 240 Minutes  Intravenous Every 8 hours 11/13/19 2137 11/14/19 0230   11/14/19 0100  cefTRIAXone (ROCEPHIN) 1 g in sodium chloride 0.9 % 100 mL IVPB     Discontinue     1 g 200 mL/hr over 30 Minutes Intravenous Every 24 hours 11/14/19 0053     11/13/19 2145  piperacillin-tazobactam (ZOSYN) IVPB 3.375 g       "Followed by" Linked Group Details   3.375 g 100 mL/hr over 30 Minutes Intravenous  Once 11/13/19 2137 11/13/19 2313    .  She was given sequential compression devices,  for DVT prophylaxis.  She benefited maximally from the hospital stay and there were no complications.    Recent vital signs:  Vitals:   11/15/19 2021 11/16/19 0432  BP: (!) 155/86 (!) 160/81  Pulse: 98 93  Resp: 18 16  Temp: 98.7 F (37.1 C) 98.2 F (36.8 C)  SpO2: (!) 87% 95%    Recent laboratory studies:  Lab Results  Component Value Date   HGB 12.8 11/15/2019   HGB 14.3 11/13/2019   HGB 13.9 02/27/2017   Lab Results  Component Value Date   WBC 10.8 (H) 11/15/2019   PLT 198 11/15/2019   No results found for: INR Lab Results  Component Value Date   NA 134 (L) 11/13/2019   K 4.0 11/13/2019   CL 99 11/13/2019   CO2 24 11/13/2019   BUN 12 11/13/2019   CREATININE 0.83  11/13/2019   GLUCOSE 258 (H) 11/13/2019    Discharge Medications:   Allergies as of 11/16/2019   No Known Allergies     Medication List    STOP taking these medications   Bydureon 2 MG Pen Generic drug: Exenatide ER   oxycodone 5 MG capsule Commonly known as: OXY-IR     TAKE these medications   acetaminophen 325 MG tablet Commonly known as: Tylenol Take 2 tablets (650 mg total) by mouth every 6 (six) hours as needed. What changed: reasons to take this   bifidobacterium infantis capsule Take 1 capsule by mouth daily.   cefdinir 300 MG capsule Commonly known as: OMNICEF Take 1 capsule (300 mg total) by mouth 2 (two) times daily.   cyclobenzaprine 5 MG tablet Commonly known as: FLEXERIL Take 5-10 mg by mouth 3 (three) times  daily as needed for muscle spasms.   docusate sodium 100 MG capsule Commonly known as: Colace Take 1 capsule (100 mg total) by mouth 2 (two) times daily. What changed:   when to take this  reasons to take this   escitalopram 20 MG tablet Commonly known as: LEXAPRO Take 20 mg by mouth daily. What changed: Another medication with the same name was removed. Continue taking this medication, and follow the directions you see here.   gabapentin 300 MG capsule Commonly known as: NEURONTIN Take 300-600 mg by mouth 3 (three) times daily. Take 1 capsule (300 mg) BID Then Take 2 capsules (600 mg) at bedtime What changed: Another medication with the same name was removed. Continue taking this medication, and follow the directions you see here.   HYDROcodone-acetaminophen 5-325 MG tablet Commonly known as: NORCO/VICODIN Take 1 tablet by mouth every 6 (six) hours as needed for moderate pain or severe pain.   ibuprofen 600 MG tablet Commonly known as: ADVIL Take 1 tablet (600 mg total) by mouth every 6 (six) hours as needed.   Melatonin 5 MG Chew Chew 1 tablet by mouth at bedtime as needed (sleep).   meloxicam 15 MG tablet Commonly known as: MOBIC Take 15 mg by mouth daily as needed for pain.   metFORMIN 500 MG tablet Commonly known as: GLUCOPHAGE Take 1,000 mg by mouth 2 (two) times daily with a meal.   omeprazole 40 MG capsule Commonly known as: PRILOSEC Take 40 mg by mouth daily as needed (indigestion).   ondansetron 4 MG tablet Commonly known as: ZOFRAN Take 4 mg by mouth every 8 (eight) hours as needed for nausea or vomiting.   Ozempic (1 MG/DOSE) 4 MG/3ML Sopn Generic drug: Semaglutide (1 MG/DOSE) Inject 1 mg into the skin once a week.   repaglinide 1 MG tablet Commonly known as: PRANDIN Take 1 mg by mouth daily as needed (blood sugar >125).   rosuvastatin 10 MG tablet Commonly known as: CRESTOR Take 1 tablet (10 mg total) by mouth daily. NEED OV.   Systane  Preservative Free 0.4-0.3 % Soln Generic drug: Polyethyl Glyc-Propyl Glyc PF Apply 1-2 drops to eye 2 (two) times daily as needed (dryness).   tamsulosin 0.4 MG Caps capsule Commonly known as: FLOMAX Take 0.4 mg by mouth daily.   traZODone 50 MG tablet Commonly known as: DESYREL Take 50 mg by mouth at bedtime as needed for sleep.   Vitamin D (Ergocalciferol) 1.25 MG (50000 UNIT) Caps capsule Commonly known as: DRISDOL Take 50,000 Units by mouth every 14 (fourteen) days.       Diagnostic Studies: CT ABDOMEN PELVIS W CONTRAST  Result Date:  11/13/2019 CLINICAL DATA:  History of kidney stones, fever and dysuria EXAM: CT ABDOMEN AND PELVIS WITH CONTRAST TECHNIQUE: Multidetector CT imaging of the abdomen and pelvis was performed using the standard protocol following bolus administration of intravenous contrast. CONTRAST:  167mL OMNIPAQUE IOHEXOL 300 MG/ML  SOLN COMPARISON:  Ultrasound 11/13/2019, CT 10/22/2019 FINDINGS: Lower chest: Lung bases demonstrate hazy dependent atelectasis. No acute consolidation or pleural effusion. Hepatobiliary: No calcified gallstone.  No biliary dilatation. Pancreas: Unremarkable. No pancreatic ductal dilatation or surrounding inflammatory changes. Spleen: Normal in size without focal abnormality. Adrenals/Urinary Tract: Stable 10 mm right adrenal nodule. Normal left adrenal gland. Similar mild left hydronephrosis and proximal hydroureter, secondary to a 3 x 4 mm stone in the proximal left ureter, without significant distal migration. The urinary bladder shows subtle foci of hyperdense density, possibly representing early excreted contrast mixed with urine. Stomach/Bowel: Stomach is within normal limits. Appendix appears normal. No evidence of bowel wall thickening, distention, or inflammatory changes. Vascular/Lymphatic: Mild aortic atherosclerosis. No aneurysm. No suspicious nodes Reproductive: Probable fibroid in the left lower uterine segment. No adnexal mass. Other:  Negative for free air or free fluid. Musculoskeletal: No acute or significant osseous findings. IMPRESSION: 1. No significant change in mild left hydronephrosis and proximal hydroureter secondary to a 3 x 4 mm stone in the proximal left ureter. This is without significant distal migration since comparison CT from 10/22/2019. 2. Probable uterine fibroid. Electronically Signed   By: Donavan Foil M.D.   On: 11/13/2019 23:20   DG Chest Port 1 View  Result Date: 11/13/2019 CLINICAL DATA:  Right-sided flank pain. EXAM: PORTABLE CHEST 1 VIEW COMPARISON:  None. FINDINGS: There is no evidence of acute infiltrate, pleural effusion or pneumothorax. The heart size and mediastinal contours are within normal limits. The visualized skeletal structures are unremarkable. IMPRESSION: No active disease. Electronically Signed   By: Virgina Norfolk M.D.   On: 11/13/2019 20:56   DG C-Arm 1-60 Min-No Report  Result Date: 11/14/2019 Fluoroscopy was utilized by the requesting physician.  No radiographic interpretation.   US Abdomen Limited RUQ  Result Date: 11/13/2019 CLINICAL DATA:  Fever and right-sided pain with nausea and vomiting. EXAM: ULTRASOUND ABDOMEN LIMITED RIGHT UPPER QUADRANT COMPARISON:  None. FINDINGS: Gallbladder: No gallstones or wall thickening visualized (1.9 mm). No sonographic Murphy sign noted by sonographer. Common bile duct: Diameter: 6.1 mm Liver: No focal lesion identified. Diffusely increased echogenicity of the liver parenchyma is noted. Portal vein is patent on color Doppler imaging with normal direction of blood flow towards the liver. Other: None. IMPRESSION: Fatty liver. Electronically Signed   By: Virgina Norfolk M.D.   On: 11/13/2019 22:13    Disposition:      Follow-up Information    Raynelle Bring, MD Follow up.   Specialty: Urology Why: If you have not heard from the office by [place].  Please call to arrange f/u with Dr. Alinda Money for your next procedure.  Contact information: [place] [address] [phone]        Izora Gala, MD Follow up.   Specialty: Otolaryngology Why: If your neck and face swelling is getting worse tomorrow, please call the office to be seen.  If it is improving and resolves, you will not need to be seen.  Contact information: [address] [place] [address] [phone]                Signed: Irine Seal 11/16/2019, 11:50 AM

## 2019-11-16 NOTE — Progress Notes (Signed)
2 Days Post-Op  Subjective: POD#2 from Cystoscopy with left stent for a 71mm left proximal ureteral stone.   She has no further nausea or chills and her pain has improved.  She has no frequency or urgency.  She has a new complaint of swelling of the left face and upper neck.  Urine culture is negative but it was obtained on antibiotics.  ROS:  Review of Systems  Constitutional: Negative for chills and fever.  HENT:       Swelling of left upper neck and face  Gastrointestinal: Positive for abdominal pain.  All other systems reviewed and are negative.   Anti-infectives: Anti-infectives (From admission, onward)   Start     Dose/Rate Route Frequency Ordered Stop   11/14/19 0400  piperacillin-tazobactam (ZOSYN) IVPB 3.375 g  Status:  Discontinued       "Followed by" Linked Group Details   3.375 g 12.5 mL/hr over 240 Minutes Intravenous Every 8 hours 11/13/19 2137 11/14/19 0230   11/14/19 0100  cefTRIAXone (ROCEPHIN) 1 g in sodium chloride 0.9 % 100 mL IVPB     Discontinue     1 g 200 mL/hr over 30 Minutes Intravenous Every 24 hours 11/14/19 0053     11/13/19 2145  piperacillin-tazobactam (ZOSYN) IVPB 3.375 g       "Followed by" Linked Group Details   3.375 g 100 mL/hr over 30 Minutes Intravenous  Once 11/13/19 2137 11/13/19 2313      Current Facility-Administered Medications  Medication Dose Route Frequency Provider Last Rate Last Admin  . 0.9 %  sodium chloride infusion   Intravenous Continuous Raynelle Bring, MD 125 mL/hr at 11/16/19 0148 New Bag at 11/16/19 0148  . acetaminophen (TYLENOL) tablet 650 mg  650 mg Oral Once Raynelle Bring, MD      . acetaminophen (TYLENOL) tablet 650 mg  650 mg Oral Q4H PRN Raynelle Bring, MD      . acidophilus (RISAQUAD) capsule 1 capsule  1 capsule Oral Daily Raynelle Bring, MD   1 capsule at 11/16/19 1010  . cefTRIAXone (ROCEPHIN) 1 g in sodium chloride 0.9 % 100 mL IVPB  1 g Intravenous Q24H Raynelle Bring, MD 200 mL/hr at 11/15/19 2133 1 g at  11/15/19 2133  . cyclobenzaprine (FLEXERIL) tablet 5-10 mg  5-10 mg Oral TID PRN Raynelle Bring, MD      . diphenhydrAMINE (BENADRYL) injection 12.5 mg  12.5 mg Intravenous Q6H PRN Raynelle Bring, MD       Or  . diphenhydrAMINE (BENADRYL) 12.5 MG/5ML elixir 12.5 mg  12.5 mg Oral Q6H PRN Raynelle Bring, MD      . docusate sodium (COLACE) capsule 100 mg  100 mg Oral BID Raynelle Bring, MD   100 mg at 11/16/19 1010  . escitalopram (LEXAPRO) tablet 20 mg  20 mg Oral Daily Raynelle Bring, MD   20 mg at 11/16/19 1010  . gabapentin (NEURONTIN) capsule 300 mg  300 mg Oral BID WC Raynelle Bring, MD   300 mg at 11/16/19 1019   And  . gabapentin (NEURONTIN) capsule 600 mg  600 mg Oral QHS Raynelle Bring, MD   600 mg at 11/15/19 2134  . HYDROcodone-acetaminophen (NORCO/VICODIN) 5-325 MG per tablet 1-2 tablet  1-2 tablet Oral Q6H PRN Irine Seal, MD   1 tablet at 11/15/19 1133  . HYDROmorphone (DILAUDID) injection 0.5-1 mg  0.5-1 mg Intravenous Q2H PRN Raynelle Bring, MD   0.5 mg at 11/15/19 1500  . insulin aspart (novoLOG) injection 0-15 Units  0-15  Units Subcutaneous Q4H Raynelle Bring, MD   2 Units at 11/16/19 0501  . metoCLOPramide (REGLAN) injection 5 mg  5 mg Intravenous Q6H PRN Raynelle Bring, MD      . ondansetron [facility]) injection 4 mg  4 mg Intravenous Q4H PRN Raynelle Bring, MD   4 mg at 11/14/19 1821  . ondansetron (ZOFRAN) tablet 4 mg  4 mg Oral Q8H PRN Raynelle Bring, MD      . pantoprazole (PROTONIX) EC tablet 40 mg  40 mg Oral Daily Raynelle Bring, MD   40 mg at 11/16/19 1010  . polyethylene glycol (MIRALAX / GLYCOLAX) packet 17 g  17 g Oral Daily Irine Seal, MD   17 g at 11/16/19 1011  . rosuvastatin (CRESTOR) tablet 10 mg  10 mg Oral Daily Raynelle Bring, MD   10 mg at 11/16/19 1010  . tamsulosin (FLOMAX) capsule 0.4 mg  0.4 mg Oral Daily Raynelle Bring, MD   0.4 mg at 11/16/19 1010  . traZODone (DESYREL) tablet 50 mg  50 mg Oral QHS PRN Raynelle Bring, MD         Objective: Vital  signs in last 24 hours: Temp:  [97.9 F (36.6 C)-98.7 F (37.1 C)] 98.2 F (36.8 C) (07/05 0432) Pulse Rate:  [93-98] 93 (07/05 0432) Resp:  [14-18] 16 (07/05 0432) BP: (151-160)/(81-92) 160/81 (07/05 0432) SpO2:  [87 %-97 %] 95 % (07/05 0432)  Intake/Output from previous day: 07/04 0701 - 07/05 0700 In: 3329.5 [P.O.:720; I.V.:2509.5; IV Piggyback:100.1] Out: 500 [Urine:500] Intake/Output this shift: Total I/O In: -  Out: 900 [Urine:900]   Physical Exam Vitals reviewed.  Constitutional:      Appearance: Normal appearance. She is obese.  HENT:     Head:     Comments: There is firm swelling anterior to the left ear along the jaw and upper neck with mild tenderness but no overlying erythema.  Cardiovascular:     Rate and Rhythm: Normal rate and regular rhythm.  Pulmonary:     Effort: Pulmonary effort is normal. No respiratory distress.  Abdominal:     Palpations: Abdomen is soft.     Tenderness: There is abdominal tenderness (mild epigastric).  Musculoskeletal:        General: No tenderness.  Skin:    General: Skin is warm and dry.  Neurological:     General: No focal deficit present.     Mental Status: She is alert and oriented to person, place, and time.  Psychiatric:        Mood and Affect: Mood normal.        Behavior: Behavior normal.     Lab Results:  Recent Labs    11/13/19 1902 11/15/19 0554  WBC 15.4* 10.8*  HGB 14.3 12.8  HCT 44.0 37.1  PLT 220 198   BMET Recent Labs    11/13/19 1902  NA 134*  K 4.0  CL 99  CO2 24  GLUCOSE 258*  BUN 12  CREATININE 0.83  CALCIUM 9.0   PT/INR No results for input(s): LABPROT, INR in the last 72 hours. ABG No results for input(s): PHART, HCO3 in the last 72 hours.  Invalid input(s): PCO2, PO2  Studies/Results: No results found.   Assessment and Plan: - left ureteral stone with possible infection POD#2  from stent insertion.  She is afebrile with reduced pain.  Culture negative but it was done after  antibiotics started.  I will send home on empiric therapy with cefdinir.   - tender swelling  of the left face and neck, possible parotid inflammation.  I had a phone consultation with Dr. Constance Holster with ENT who felt the cefdinir would be appropriate and recommended local heat with f/u in the office if not improving tomorrow.      LOS: 2 days    Irine Seal 11/16/2019 [phone] ID: Isabella Galloway, female   DOB: 1965-08-31, 54 y.o.   MRN: [id number] Patient ID: Isabella Galloway, female   DOB: 1966-05-08, 54 y.o.   MRN: [id number]

## 2019-11-16 NOTE — Progress Notes (Signed)
Patient discharged home with family. Discharge instructions given and explained to patient, she verbalized understanding, denies any distress, No wound noted. Accompanied home by family

## 2019-11-18 LAB — CULTURE, BLOOD (ROUTINE X 2)
Culture: NO GROWTH
Culture: NO GROWTH

## 2019-11-30 ENCOUNTER — Other Ambulatory Visit: Payer: Self-pay | Admitting: Urology

## 2019-12-09 NOTE — Patient Instructions (Addendum)
DUE TO COVID-19 ONLY ONE VISITOR IS ALLOWED TO COME WITH YOU AND STAY IN THE WAITING ROOM ONLY DURING PRE OP AND PROCEDURE DAY OF SURGERY. THE 2 VISITOR MAY VISIT WITH YOU AFTER SURGERY IN YOUR PRIVATE ROOM DURING VISITING HOURS ONLY!  YOU NEED TO HAVE A COVID 19 TEST ON__7/29_____ @_11 :05______, THIS TEST MUST BE DONE BEFORE SURGERY, COME  [place] [place] , [id number].  [facility]) ,                 [place]    Your procedure is scheduled on: 12/14/19   Report to [facility] Main  Entrance   Report to admitting at   13:00 AM     Call this number if you have problems the morning of surgery [phone]    Remember: Do not eat food or drink liquids :After Midnight. BRUSH YOUR TEETH MORNING OF SURGERY AND RINSE YOUR MOUTH OUT, NO CHEWING GUM CANDY OR MINTS.     Take these medicines the morning of surgery with A SIP OF WATER: Gabapentin, Lexipro, Prilosec, use your eye drops   DO NOT TAKE ANY DIABETIC MEDICATIONS DAY OF YOUR SURGERY     How to Manage Your Diabetes Before and After Surgery  Why is it important to control my blood sugar before and after surgery? . Improving blood sugar levels before and after surgery helps healing and can limit problems. . A way of improving blood sugar control is eating a healthy diet by: o  Eating less sugar and carbohydrates o  Increasing activity/exercise o  Talking with your doctor about reaching your blood sugar goals . High blood sugars (greater than 180 mg/dL) can raise your risk of infections and slow your recovery, so you will need to focus on controlling your diabetes during the weeks before surgery. . Make sure that the doctor who takes care of your diabetes knows about your planned surgery including the date and location.  How do I manage my blood sugar before surgery? . Check your blood sugar at least 4 times a day, starting 2 days before surgery, to make sure that the level is not too high  or low. o Check your blood sugar the morning of your surgery when you wake up and every 2 hours until you get to the Short Stay unit. . If your blood sugar is less than 70 mg/dL, you will need to treat for low blood sugar: o Do not take insulin. o Treat a low blood sugar (less than 70 mg/dL) with  cup of clear juice (cranberry or apple), 4 glucose tablets, OR glucose gel. o Recheck blood sugar in 15 minutes after treatment (to make sure it is greater than 70 mg/dL). If your blood sugar is not greater than 70 mg/dL on recheck, call [phone] for further instructions. . Report your blood sugar to the short stay nurse when you get to Short Stay.  . If you are admitted to the hospital after surgery: o Your blood sugar will be checked by the staff and you will probably be given insulin after surgery (instead of oral diabetes medicines) to make sure you have good blood sugar levels. o The goal for blood sugar control after surgery is 80-180 mg/dL.   WHAT DO I DO ABOUT MY DIABETES MEDICATION?  Marland Kitchen Do not take oral diabetes medicines (pills) the morning of surgery.  The day of surgery, do not take other diabetes injectables, including Byetta (exenatide), Bydureon (exenatide  ER), Victoza (liraglutide), or Trulicity (dulaglutide).  If                           You may not have any metal on your body including hair pins and              piercings  Do not wear jewelry, make-up, lotions, powders or perfumes, deodorant             Do not wear nail polish on your fingernails.  Do not shave  48 hours prior to surgery.                 Do not bring valuables to the hospital. [facility].  Contacts, dentures or bridgework may not be worn into surgery.       Patients discharged the day of surgery will not be allowed to drive home.   IF YOU ARE HAVING SURGERY AND GOING HOME THE SAME DAY, YOU MUST HAVE AN ADULT TO DRIVE YOU HOME AND BE WITH YOU FOR 24  HOURS.   YOU MAY GO HOME BY TAXI OR UBER OR ORTHERWISE, BUT AN ADULT MUST ACCOMPANY YOU HOME AND STAY WITH YOU FOR 24 HOURS.  Name and phone number of your driver:  Special Instructions: N/A              Please read over the following fact sheets you were given: _____________________________________________________________________             [facility] - Preparing for Surgery Before surgery, you can play an important role .  Because skin is not sterile, your skin needs to be as free of germs as possible.   You can reduce the number of germs on your skin by washing with CHG (chlorahexidine gluconate) soap before surgery.   CHG is an antiseptic cleaner which kills germs and bonds with the skin to continue killing germs even after washing. Please DO NOT use if you have an allergy to CHG or antibacterial soaps.   If your skin becomes reddened/irritated stop using the CHG and inform your nurse when you arrive at Short Stay. Do not shave (including legs and underarms) for at least 48 hours prior to the first CHG shower.   Please follow these instructions carefully:   1.  Shower with CHG Soap the night before surgery and the  morning of Surgery.  2.  If you choose to wash your hair, wash your hair first as usual with your  normal  shampoo.  3.  After you shampoo, rinse your hair and body thoroughly to remove the  shampoo.                                        4.  Use CHG as you would any other liquid soap.  You can apply chg directly  to the skin and wash                       Gently with a scrungie or clean washcloth.  5.  Apply the CHG Soap to your body ONLY FROM THE NECK DOWN.   Do not use on face/ open  Wound or open sores. Avoid contact with eyes, ears mouth and genitals (private parts).                       Wash face,  Genitals (private parts) with your normal soap.             6.  Wash thoroughly, paying special attention to the area where your surgery  will  be performed.  7.  Thoroughly rinse your body with warm water from the neck down.  8.  DO NOT shower/wash with your normal soap after using and rinsing off  the CHG Soap.             9.  Pat yourself dry with a clean towel.            10.  Wear clean pajamas.            11.  Place clean sheets on your bed the night of your first shower and do not  sleep with pets.  Day of Surgery : Do not apply any lotions/deodorants the morning of surgery.  Please wear clean clothes to the hospital/surgery center.  FAILURE TO FOLLOW THESE INSTRUCTIONS MAY RESULT IN THE CANCELLATION OF YOUR SURGERY PATIENT SIGNATURE_________________________________  NURSE SIGNATURE__________________________________  ________________________________________________________________________

## 2019-12-09 NOTE — H&P (Signed)
Office Visit Report     11/24/2019   --------------------------------------------------------------------------------   Isabella Galloway  MRN: [id number]  DOB: 08-05-1965, 54 year old Female  SSN:    PRIMARY CARE:    REFERRING:    PROVIDER:  Raynelle Galloway, M.D.  LOCATION:  [facility] [phone]     --------------------------------------------------------------------------------   CC/HPI: Left ureteral calculus   [name] returns today after her recent hospitalization 10 days ago. She was found to be febrile with a 4 mm proximal left ureteral calculus and underwent urgent ureteral stenting. Her urine cultures unfortunately were un informative. She was discharged home on empiric antibiotics and has now completed them. She follows up today for a repeat urinalysis. She feels much improved. She denies any fever. She has had some intermittent hematuria and expected urgency/frequency with her indwelling stent.     ALLERGIES: NKDA    MEDICATIONS: Crestor 10 mg tablet  Metformin Hcl 500 mg tablet  Omeprazole 40 mg capsule,delayed release  Align  Bydureon Bcise 2 mg/0.85 ml auto-injector  Colace  Flexeril 1 PO Daily  Flomax 0.4 mg capsule  Gabapentin 300 mg capsule  Hydrocodone-Acetaminophen 5 mg-325 mg tablet  Lexapro 20 mg tablet  Melatonin 5 mg tablet,chewable  Meloxicam 15 mg tablet  Ozempic  Prandin 1 mg tablet  Trazodone Hcl  Tylenol 325 mg capsule  Vitamin D3 1,250 mcg (50,000 unit) capsule  Zofran 4 mg tablet     GU PSH: Cystoscopy Insert Stent, Left - 11/14/2019 Hysterectomy     NON-GU PSH: Back Surgery (Unspecified) Breast Surgery Procedure Colonoscopy Drain Pilonidal Cyst Foot surgery (unspecified), Right     GU PMH: None     PMH Notes:   1) Urolithiasis:    NON-GU PMH: Anxiety Depression Diabetes Type 2 GERD Hypercholesterolemia Peripheral vascular disease Sleep Apnea    FAMILY HISTORY: abdominal aortic aneurysm -  Brother, Mother Cancer - Runs in Family COPD (chronic obstructive pulmonary disease) with - Mother Diabetes - Runs in Family Heart Disease - Mother, Grandfather, Uncle Lung Cancer - Mother   SOCIAL HISTORY: Marital Status: Single Preferred Language: English; Ethnicity: Not Hispanic Or Latino; Race: White Current Smoking Status: Patient has never smoked.   Tobacco Use Assessment Completed: Used Tobacco in last 30 days? Has never drank.  Does not drink caffeine.    REVIEW OF SYSTEMS:    GU Review Female:   Patient reports frequent urination, burning /pain with urination, and get up at night to urinate. Patient denies hard to postpone urination, leakage of urine, stream starts and stops, trouble starting your stream, have to strain to urinate, and currently pregnant.  Gastrointestinal (Lower):   Patient denies diarrhea and constipation.  Gastrointestinal (Upper):   Patient denies nausea and vomiting.  Constitutional:   Patient denies fever, night sweats, weight loss, and fatigue.  Skin:   Patient denies skin rash/ lesion and itching.  Eyes:   Patient denies blurred vision and double vision.  Ears/ Nose/ Throat:   Patient denies sore throat and sinus problems.  Hematologic/Lymphatic:   Patient denies swollen glands and easy bruising.  Cardiovascular:   Patient denies leg swelling and chest pains.  Respiratory:   Patient denies cough and shortness of breath.  Endocrine:   Patient denies excessive thirst.  Musculoskeletal:   Patient denies back pain and joint pain.  Neurological:   Patient denies headaches and dizziness.  Psychologic:   Patient denies depression and anxiety.   VITAL SIGNS:  11/24/2019 01:32 PM  Weight 191 lb / 86.64 kg  Height 62 in / 157.48 cm  BP 128/82 mmHg  Pulse 97 /min  BMI 34.9 kg/m   MULTI-SYSTEM PHYSICAL EXAMINATION:    Constitutional: Well-nourished. No physical deformities. Normally developed. Good grooming.  Neck: Neck symmetrical, not swollen.  Normal tracheal position.  Respiratory: No labored breathing, no use of accessory muscles. Clear bilaterally.  Cardiovascular: Normal temperature, normal extremity pulses, no swelling, no varicosities. Regular rate and rhythm.  Lymphatic: No enlargement of neck, axillae, groin.  Skin: No paleness, no jaundice, no cyanosis. No lesion, no ulcer, no rash.  Neurologic / Psychiatric: Oriented to time, oriented to place, oriented to person. No depression, no anxiety, no agitation.  Gastrointestinal: No mass, no tenderness, no rigidity, non obese abdomen.  Eyes: Normal conjunctivae. Normal eyelids.  Ears, Nose, Mouth, and Throat: Left ear no scars, no lesions, no masses. Right ear no scars, no lesions, no masses. Nose no scars, no lesions, no masses. Normal hearing. Normal lips.  Musculoskeletal: Normal gait and station of head and neck.     Complexity of Data:  Records Review:   Previous Patient Records  X-Ray Review: C.T. Abdomen/Pelvis: Reviewed Films.    Notes:                     CLINICAL DATA: History of kidney stones, fever and dysuria   EXAM:  CT ABDOMEN AND PELVIS WITH CONTRAST   TECHNIQUE:  Multidetector CT imaging of the abdomen and pelvis was performed  using the standard protocol following bolus administration of  intravenous contrast.   CONTRAST: 160mL OMNIPAQUE IOHEXOL 300 MG/ML SOLN   COMPARISON: Ultrasound 11/13/2019, CT 10/22/2019   FINDINGS:  Lower chest: Lung bases demonstrate hazy dependent atelectasis. No  acute consolidation or pleural effusion.   Hepatobiliary: No calcified gallstone. No biliary dilatation.   Pancreas: Unremarkable. No pancreatic ductal dilatation or  surrounding inflammatory changes.   Spleen: Normal in size without focal abnormality.   Adrenals/Urinary Tract: Stable 10 mm right adrenal nodule. Normal  left adrenal gland. Similar mild left hydronephrosis and proximal  hydroureter, secondary to a 3 x 4 mm stone in the proximal left   ureter, without significant distal migration. The urinary bladder  shows subtle foci of hyperdense density, possibly representing early  excreted contrast mixed with urine.   Stomach/Bowel: Stomach is within normal limits. Appendix appears  normal. No evidence of bowel wall thickening, distention, or  inflammatory changes.   Vascular/Lymphatic: Mild aortic atherosclerosis. No aneurysm. No  suspicious nodes   Reproductive: Probable fibroid in the left lower uterine segment. No  adnexal mass.   Other: Negative for free air or free fluid.   Musculoskeletal: No acute or significant osseous findings.   IMPRESSION:  1. No significant change in mild left hydronephrosis and proximal  hydroureter secondary to a 3 x 4 mm stone in the proximal left  ureter. This is without significant distal migration since  comparison CT from 10/22/2019.  2. Probable uterine fibroid.    Electronically Signed  By: Donavan Foil M.D.  On: 11/13/2019 23:20   PROCEDURES:          Urinalysis w/Scope Dipstick Dipstick Cont'd Micro  Color: Red Bilirubin: Neg mg/dL WBC/hpf: 6 - 10/hpf  Appearance: Turbid Ketones: Trace mg/dL RBC/hpf: >60/hpf  Specific Gravity: 1.030 Blood: 3+ ery/uL Bacteria: Few (10-25/hpf)  pH: 5.5 Protein: 3+ mg/dL Cystals: Amorph Urates  Glucose: 2+ mg/dL Urobilinogen: 0.2 mg/dL Casts: NS (Not Seen)  Nitrites: Positive Trichomonas: Not Present    Leukocyte Esterase: 2+ leu/uL Mucous: Not Present      Epithelial Cells: 0 - 5/hpf      Yeast: NS (Not Seen)      Sperm: Not Present    Notes: MICROSCOPIC NOT CONCENTRATED    ASSESSMENT:      ICD-10 Details  1 GU:   Ureteral calculus - N20.1    PLAN:           Orders Labs Urine Culture          Schedule Return Visit/Planned Activity: Other See Visit Notes             Note: Will call to schedule surgery.          Document Letter(s):  Created for Patient: Clinical Summary         Notes:   1. Left ureteral calculus: Her  urine will be cultured today. She will be treated with preoperative antibiotics if necessary. After reviewing options, she is agreeable to proceed with left ureteroscopic laser lithotripsy for definitive treatment. We reviewed this procedure in detail including the potential risks, complications, and the expected recovery process. She gives informed consent. This will be scheduled.       * Signed by Isabella Galloway, M.D. on 11/24/19 at 4:58 PM (EDT*

## 2019-12-10 ENCOUNTER — Other Ambulatory Visit (HOSPITAL_COMMUNITY)
Admission: RE | Admit: 2019-12-10 | Discharge: 2019-12-10 | Disposition: A | Discharge status: Home / Self Care | Payer: Medicare Other | Source: Ambulatory Visit | Attending: Urology | Admitting: Urology

## 2019-12-10 ENCOUNTER — Encounter (HOSPITAL_COMMUNITY): Payer: Self-pay

## 2019-12-10 ENCOUNTER — Encounter (HOSPITAL_COMMUNITY)
Admission: RE | Admit: 2019-12-10 | Discharge: 2019-12-10 | Disposition: A | Discharge status: Home / Self Care | Payer: Medicare Other | Source: Ambulatory Visit | Attending: Urology | Admitting: Urology

## 2019-12-10 ENCOUNTER — Other Ambulatory Visit: Payer: Self-pay

## 2019-12-10 DIAGNOSIS — Z01812 Encounter for preprocedural laboratory examination: Secondary | ICD-10-CM | POA: Diagnosis not present

## 2019-12-10 DIAGNOSIS — Z20822 Contact with and (suspected) exposure to covid-19: Secondary | ICD-10-CM | POA: Diagnosis not present

## 2019-12-10 HISTORY — DX: Unspecified background retinopathy: H35.00

## 2019-12-10 HISTORY — DX: Polyneuropathy, unspecified: G62.9

## 2019-12-10 HISTORY — DX: Unspecified osteoarthritis, unspecified site: M19.90

## 2019-12-10 HISTORY — DX: Dyspnea, unspecified: R06.00

## 2019-12-10 LAB — CBC
HCT: 41.4 % (ref 36.0–46.0)
Hemoglobin: 13.4 g/dL (ref 12.0–15.0)
MCH: 30.6 pg (ref 26.0–34.0)
MCHC: 32.4 g/dL (ref 30.0–36.0)
MCV: 94.5 fL (ref 80.0–100.0)
Platelets: 243 10*3/uL (ref 150–400)
RBC: 4.38 MIL/uL (ref 3.87–5.11)
RDW: 13.2 % (ref 11.5–15.5)
WBC: 7.4 10*3/uL (ref 4.0–10.5)
nRBC: 0 % (ref 0.0–0.2)

## 2019-12-10 LAB — BASIC METABOLIC PANEL
Anion gap: 8 (ref 5–15)
BUN: 15 mg/dL (ref 6–20)
CO2: 28 mmol/L (ref 22–32)
Calcium: 9.5 mg/dL (ref 8.9–10.3)
Chloride: 103 mmol/L (ref 98–111)
Creatinine, Ser: 0.5 mg/dL (ref 0.44–1.00)
GFR calc Af Amer: >60 mL/min (ref >60)
GFR calc non Af Amer: >60 mL/min (ref >60)
Glucose, Bld: 170 mg/dL — ABNORMAL HIGH (ref 70–99)
Potassium: 4.9 mmol/L (ref 3.5–5.1)
Sodium: 139 mmol/L (ref 135–145)

## 2019-12-10 LAB — HEMOGLOBIN A1C
Hgb A1c MFr Bld: 8.4 % — ABNORMAL HIGH (ref 4.8–5.6)
Mean Plasma Glucose: 194.38 mg/dL

## 2019-12-10 LAB — GLUCOSE, CAPILLARY: Glucose-Capillary: 175 mg/dL — ABNORMAL HIGH (ref 70–99)

## 2019-12-10 LAB — SARS CORONAVIRUS 2 (TAT 6-24 HRS): SARS Coronavirus 2: NEGATIVE

## 2019-12-10 NOTE — Progress Notes (Signed)
COVID Vaccine Completed:Yes Date COVID Vaccine completed:08/01/19 COVID vaccine manufacturer:  Moderna     PCP - Marrianne Mood NP Cardiologist - Dr. Jerilynn Mages. Croitoru  Chest x-ray - 11/13/19 EKG - 11/17/19 Stress Test - 12/05/16 ECHO - 10/26/16 Cardiac Cath - no  Sleep Study - yes CPAP - no panics with mask on face  Fasting Blood Sugar - 140-180 Checks Blood Sugar _____ times a day Pt has a sensor on her arm. She checks frequently  Blood Thinner Instructions:NA Aspirin Instructions: Last Dose:  Anesthesia review:   Patient denies shortness of breath, fever, cough and chest pain at PAT appointment  yes   Patient verbalized understanding of instructions that were given to them at the PAT appointment. Patient was also instructed that they will need to review over the PAT instructions again at home before surgery.  Yes  She has chronic back pain and can't walk very far. She had injections April and may of '21 so she is able to walk without her cane.  Her CBGs have been trending higher recently. I told her to call her MD if they continue to be high prior to surgery.  Pt reports that she is SOB after 5 stairs. This has been normal for her for the past 2 years. No SOB with ADLs

## 2019-12-11 NOTE — Progress Notes (Signed)
Pt called with surgery time change. Message was left

## 2019-12-14 ENCOUNTER — Ambulatory Visit (HOSPITAL_COMMUNITY): Payer: Medicare Other

## 2019-12-14 ENCOUNTER — Ambulatory Visit (HOSPITAL_COMMUNITY)
Admission: RE | Admit: 2019-12-14 | Discharge: 2019-12-14 | Disposition: A | Discharge status: Home / Self Care | Payer: Medicare Other | Attending: Urology | Admitting: Urology

## 2019-12-14 ENCOUNTER — Encounter (HOSPITAL_COMMUNITY)
Admission: RE | Disposition: A | Discharge status: Home / Self Care | Payer: Self-pay | Source: Home / Self Care | Attending: Urology

## 2019-12-14 ENCOUNTER — Encounter (HOSPITAL_COMMUNITY): Payer: Self-pay | Admitting: Urology

## 2019-12-14 ENCOUNTER — Ambulatory Visit (HOSPITAL_COMMUNITY): Payer: Medicare Other | Admitting: Physician Assistant

## 2019-12-14 ENCOUNTER — Other Ambulatory Visit: Payer: Self-pay

## 2019-12-14 ENCOUNTER — Ambulatory Visit (HOSPITAL_COMMUNITY): Payer: Medicare Other | Admitting: Certified Registered"

## 2019-12-14 DIAGNOSIS — G4730 Sleep apnea, unspecified: Secondary | ICD-10-CM | POA: Insufficient documentation

## 2019-12-14 DIAGNOSIS — Z801 Family history of malignant neoplasm of trachea, bronchus and lung: Secondary | ICD-10-CM | POA: Insufficient documentation

## 2019-12-14 DIAGNOSIS — Z90710 Acquired absence of both cervix and uterus: Secondary | ICD-10-CM | POA: Insufficient documentation

## 2019-12-14 DIAGNOSIS — Z833 Family history of diabetes mellitus: Secondary | ICD-10-CM | POA: Insufficient documentation

## 2019-12-14 DIAGNOSIS — Z825 Family history of asthma and other chronic lower respiratory diseases: Secondary | ICD-10-CM | POA: Insufficient documentation

## 2019-12-14 DIAGNOSIS — K219 Gastro-esophageal reflux disease without esophagitis: Secondary | ICD-10-CM | POA: Diagnosis not present

## 2019-12-14 DIAGNOSIS — Z7984 Long term (current) use of oral hypoglycemic drugs: Secondary | ICD-10-CM | POA: Diagnosis not present

## 2019-12-14 DIAGNOSIS — Z79899 Other long term (current) drug therapy: Secondary | ICD-10-CM | POA: Insufficient documentation

## 2019-12-14 DIAGNOSIS — Z809 Family history of malignant neoplasm, unspecified: Secondary | ICD-10-CM | POA: Insufficient documentation

## 2019-12-14 DIAGNOSIS — F329 Major depressive disorder, single episode, unspecified: Secondary | ICD-10-CM | POA: Diagnosis not present

## 2019-12-14 DIAGNOSIS — F419 Anxiety disorder, unspecified: Secondary | ICD-10-CM | POA: Diagnosis not present

## 2019-12-14 DIAGNOSIS — Z8249 Family history of ischemic heart disease and other diseases of the circulatory system: Secondary | ICD-10-CM | POA: Insufficient documentation

## 2019-12-14 DIAGNOSIS — E1151 Type 2 diabetes mellitus with diabetic peripheral angiopathy without gangrene: Secondary | ICD-10-CM | POA: Diagnosis not present

## 2019-12-14 DIAGNOSIS — E7800 Pure hypercholesterolemia, unspecified: Secondary | ICD-10-CM | POA: Diagnosis not present

## 2019-12-14 DIAGNOSIS — Z791 Long term (current) use of non-steroidal anti-inflammatories (NSAID): Secondary | ICD-10-CM | POA: Insufficient documentation

## 2019-12-14 DIAGNOSIS — N132 Hydronephrosis with renal and ureteral calculous obstruction: Secondary | ICD-10-CM | POA: Diagnosis present

## 2019-12-14 HISTORY — PX: CYSTOSCOPY/URETEROSCOPY/HOLMIUM LASER/STENT PLACEMENT: SHX6546

## 2019-12-14 LAB — GLUCOSE, CAPILLARY
Glucose-Capillary: 189 mg/dL — ABNORMAL HIGH (ref 70–99)
Glucose-Capillary: 144 mg/dL — ABNORMAL HIGH (ref 70–99)

## 2019-12-14 SURGERY — CYSTOSCOPY/URETEROSCOPY/HOLMIUM LASER/STENT PLACEMENT
Anesthesia: General | Laterality: Left

## 2019-12-14 MED ORDER — ALBUTEROL SULFATE HFA 108 (90 BASE) MCG/ACT IN AERS
INHALATION_SPRAY | RESPIRATORY_TRACT | Status: DC | PRN
Start: 2019-12-14 — End: 2019-12-14
  Administered 2019-12-14: 4 via RESPIRATORY_TRACT

## 2019-12-14 MED ORDER — ONDANSETRON HCL 4 MG/2ML IJ SOLN
INTRAMUSCULAR | Status: DC | PRN
  Administered 2019-12-14 (×2): 4 mg via INTRAVENOUS

## 2019-12-14 MED ORDER — MIDAZOLAM HCL 2 MG/2ML IJ SOLN
INTRAMUSCULAR | Status: DC | PRN
  Administered 2019-12-14: 1 mg via INTRAVENOUS

## 2019-12-14 MED ORDER — SUCCINYLCHOLINE CHLORIDE 200 MG/10ML IV SOSY
PREFILLED_SYRINGE | INTRAVENOUS | Status: DC | PRN
  Administered 2019-12-14: 160 mg via INTRAVENOUS

## 2019-12-14 MED ORDER — ACETAMINOPHEN 10 MG/ML IV SOLN
INTRAVENOUS | Status: AC
  Filled 2019-12-14: qty 100

## 2019-12-14 MED ORDER — DEXAMETHASONE SODIUM PHOSPHATE 10 MG/ML IJ SOLN
INTRAMUSCULAR | Status: DC | PRN
  Administered 2019-12-14: 4 mg via INTRAVENOUS

## 2019-12-14 MED ORDER — ONDANSETRON HCL 4 MG/2ML IJ SOLN
INTRAMUSCULAR | Status: AC
  Filled 2019-12-14: qty 2

## 2019-12-14 MED ORDER — LACTATED RINGERS IV SOLN: INTRAVENOUS | Status: DC

## 2019-12-14 MED ORDER — LIDOCAINE 2% (20 MG/ML) 5 ML SYRINGE
INTRAMUSCULAR | Status: DC | PRN
  Administered 2019-12-14: 100 mg via INTRAVENOUS

## 2019-12-14 MED ORDER — LIDOCAINE 2% (20 MG/ML) 5 ML SYRINGE
INTRAMUSCULAR | Status: AC
  Filled 2019-12-14: qty 5

## 2019-12-14 MED ORDER — CHLORHEXIDINE GLUCONATE 0.12 % MT SOLN
15.0000 mL | Freq: Once | OROMUCOSAL | Status: AC
  Administered 2019-12-14: 15 mL via OROMUCOSAL

## 2019-12-14 MED ORDER — FENTANYL CITRATE (PF) 250 MCG/5ML IJ SOLN
INTRAMUSCULAR | Status: DC | PRN
  Administered 2019-12-14 (×2): 50 ug via INTRAVENOUS

## 2019-12-14 MED ORDER — OXYCODONE HCL 5 MG PO TABS
ORAL_TABLET | ORAL | Status: AC
  Filled 2019-12-14: qty 1

## 2019-12-14 MED ORDER — IOHEXOL 300 MG/ML  SOLN
INTRAMUSCULAR | Status: DC | PRN
  Administered 2019-12-14: 15 mL

## 2019-12-14 MED ORDER — PROPOFOL 10 MG/ML IV BOLUS
INTRAVENOUS | Status: AC
  Filled 2019-12-14: qty 20

## 2019-12-14 MED ORDER — ROCURONIUM BROMIDE 10 MG/ML (PF) SYRINGE
PREFILLED_SYRINGE | INTRAVENOUS | Status: AC
  Filled 2019-12-14: qty 20

## 2019-12-14 MED ORDER — ORAL CARE MOUTH RINSE: 15.0000 mL | Freq: Once | OROMUCOSAL | Status: AC

## 2019-12-14 MED ORDER — MIDAZOLAM HCL 2 MG/2ML IJ SOLN
INTRAMUSCULAR | Status: AC
  Filled 2019-12-14: qty 2

## 2019-12-14 MED ORDER — SODIUM CHLORIDE 0.9 % IR SOLN
Status: DC | PRN
  Administered 2019-12-14: 3000 mL

## 2019-12-14 MED ORDER — SODIUM CHLORIDE 0.9 % IV SOLN
2.0000 g | Freq: Once | INTRAVENOUS | Status: AC
  Administered 2019-12-14: 2 g via INTRAVENOUS
  Filled 2019-12-14: qty 20

## 2019-12-14 MED ORDER — ACETAMINOPHEN 500 MG PO TABS
1000.0000 mg | ORAL_TABLET | Freq: Once | ORAL | Status: DC
  Filled 2019-12-14: qty 2

## 2019-12-14 MED ORDER — FENTANYL CITRATE (PF) 100 MCG/2ML IJ SOLN
INTRAMUSCULAR | Status: AC
  Filled 2019-12-14: qty 2

## 2019-12-14 MED ORDER — DEXAMETHASONE SODIUM PHOSPHATE 10 MG/ML IJ SOLN
INTRAMUSCULAR | Status: AC
  Filled 2019-12-14: qty 1

## 2019-12-14 MED ORDER — ROCURONIUM BROMIDE 10 MG/ML (PF) SYRINGE
PREFILLED_SYRINGE | INTRAVENOUS | Status: DC | PRN
  Administered 2019-12-14: 10 mg via INTRAVENOUS

## 2019-12-14 MED ORDER — ACETAMINOPHEN 10 MG/ML IV SOLN
INTRAVENOUS | Status: DC | PRN
  Administered 2019-12-14: 1000 mg via INTRAVENOUS

## 2019-12-14 MED ORDER — OXYCODONE HCL 5 MG PO TABS
5.0000 mg | ORAL_TABLET | Freq: Once | ORAL | Status: AC
  Administered 2019-12-14: 5 mg via ORAL

## 2019-12-14 MED ORDER — SUCCINYLCHOLINE CHLORIDE 200 MG/10ML IV SOSY
PREFILLED_SYRINGE | INTRAVENOUS | Status: AC
  Filled 2019-12-14: qty 10

## 2019-12-14 MED ORDER — PROPOFOL 10 MG/ML IV BOLUS
INTRAVENOUS | Status: DC | PRN
  Administered 2019-12-14: 100 mg via INTRAVENOUS

## 2019-12-14 SURGICAL SUPPLY — 16 items
BAG URO CATCHER STRL LF (MISCELLANEOUS) ×3 IMPLANT
BASKET ZERO TIP NITINOL 2.4FR (BASKET) ×2 IMPLANT
BSKT STON RTRVL ZERO TP 2.4FR (BASKET) ×1
CATH INTERMIT  6FR 70CM (CATHETERS) ×2 IMPLANT
CLOTH BEACON ORANGE TIMEOUT ST (SAFETY) ×3 IMPLANT
GLOVE BIOGEL M STRL SZ7.5 (GLOVE) ×3 IMPLANT
GOWN STRL REUS W/TWL LRG LVL3 (GOWN DISPOSABLE) ×3 IMPLANT
GUIDEWIRE STR DUAL SENSOR (WIRE) ×5 IMPLANT
GUIDEWIRE ZIPWRE .038 STRAIGHT (WIRE) ×2 IMPLANT
KIT TURNOVER KIT A (KITS) IMPLANT
MANIFOLD NEPTUNE II (INSTRUMENTS) ×3 IMPLANT
PACK CYSTO (CUSTOM PROCEDURE TRAY) ×3 IMPLANT
SHEATH URETERAL 12FRX35CM (MISCELLANEOUS) IMPLANT
TUBING CONNECTING 10 (TUBING) ×2 IMPLANT
TUBING CONNECTING 10' (TUBING) ×1
TUBING UROLOGY SET (TUBING) ×3 IMPLANT

## 2019-12-14 NOTE — Anesthesia Preprocedure Evaluation (Addendum)
Anesthesia Evaluation  Patient identified by MRN, date of birth, ID band Patient awake and Patient unresponsive    Reviewed: Allergy & Precautions, Patient's Chart, lab work & pertinent test results  Airway Mallampati: III  TM Distance: >3 FB Neck ROM: Full  Mouth opening: Limited Mouth Opening  Dental no notable dental hx. (+) Teeth Intact, Dental Advisory Given   Pulmonary sleep apnea (no CPAP) ,    Pulmonary exam normal breath sounds clear to auscultation       Cardiovascular + CAD  Normal cardiovascular exam Rhythm:Regular Rate:Normal  HLD  TTE 2018 Normal EF, G1DD, valves ok  Stress Test 2018 Nuclear stress EF: 65%. The left ventricular ejection fraction is normal (55-65%). There was no ST segment deviation noted during stress. The study is normal. This is a low risk study.    Neuro/Psych PSYCHIATRIC DISORDERS Anxiety Depression negative neurological ROS     GI/Hepatic Neg liver ROS, GERD  Medicated,  Endo/Other  negative endocrine ROSdiabetes, Type 2, Oral Hypoglycemic Agents  Renal/GU negative Renal ROS  negative genitourinary   Musculoskeletal  (+) Arthritis ,   Abdominal   Peds  Hematology negative hematology ROS (+)   Anesthesia Other Findings   Reproductive/Obstetrics                           Anesthesia Physical Anesthesia Plan  ASA: III  Anesthesia Plan: General   Post-op Pain Management:    Induction: Intravenous  PONV Risk Score and Plan: 3 and Ondansetron, Dexamethasone and Midazolam  Airway Management Planned: LMA  Additional Equipment:   Intra-op Plan:   Post-operative Plan: Extubation in OR  Informed Consent: I have reviewed the patients History and Physical, chart, labs and discussed the procedure including the risks, benefits and alternatives for the proposed anesthesia with the patient or authorized representative who has indicated his/her  understanding and acceptance.     Dental advisory given  Plan Discussed with: CRNA  Anesthesia Plan Comments:         Anesthesia Quick Evaluation

## 2019-12-14 NOTE — Transfer of Care (Signed)
Immediate Anesthesia Transfer of Care Note  Patient: Isabella Galloway  Procedure(s) Performed: CYSTOSCOPY/RETROGRADE/URETEROSCOPY/STENT REMOVAL (Left )  Patient Location: PACU  Anesthesia Type:General  Level of Consciousness: awake and alert   Airway & Oxygen Therapy: Patient Spontanous Breathing and Patient connected to face mask oxygen  Post-op Assessment: Report given to RN and Post -op Vital signs reviewed and stable  Post vital signs: Reviewed and stable  Last Vitals:  Vitals Value Taken Time  BP 160/90 12/14/19 1249  Temp    Pulse 106 12/14/19 1251  Resp 12 12/14/19 1251  SpO2 100 % 12/14/19 1251  Vitals shown include unvalidated device data.  Last Pain:  Vitals:   12/14/19 1004  TempSrc:   PainSc: 0-No pain         Complications: No complications documented.

## 2019-12-14 NOTE — Discharge Instructions (Signed)
1. You may see some blood in the urine and may have some burning with urination for 48-72 hours. You also may notice that you have to urinate more frequently or urgently after your procedure which is normal.  °2. You should call should you develop an inability urinate, fever > 101, persistent nausea and vomiting that prevents you from eating or drinking to stay hydrated.  ° ° °General Anesthesia, Adult, Care After °This sheet gives you information about how to care for yourself after your procedure. Your health care provider may also give you more specific instructions. If you have problems or questions, contact your health care provider. °What can I expect after the procedure? °After the procedure, the following side effects are common: °· Pain or discomfort at the IV site. °· Nausea. °· Vomiting. °· Sore throat. °· Trouble concentrating. °· Feeling cold or chills. °· Weak or tired. °· Sleepiness and fatigue. °· Soreness and body aches. These side effects can affect parts of the body that were not involved in surgery. °Follow these instructions at home: ° °For at least 24 hours after the procedure: °· Have a responsible adult stay with you. It is important to have someone help care for you until you are awake and alert. °· Rest as needed. °· Do not: °? Participate in activities in which you could fall or become injured. °? Drive. °? Use heavy machinery. °? Drink alcohol. °? Take sleeping pills or medicines that cause drowsiness. °? Make important decisions or sign legal documents. °? Take care of children on your own. °Eating and drinking °· Follow any instructions from your health care provider about eating or drinking restrictions. °· When you feel hungry, start by eating small amounts of foods that are soft and easy to digest (bland), such as toast. Gradually return to your regular diet. °· Drink enough fluid to keep your urine pale yellow. °· If you vomit, rehydrate by drinking water, juice, or clear  broth. °General instructions °· If you have sleep apnea, surgery and certain medicines can increase your risk for breathing problems. Follow instructions from your health care provider about wearing your sleep device: °? Anytime you are sleeping, including during daytime naps. °? While taking prescription pain medicines, sleeping medicines, or medicines that make you drowsy. °· Return to your normal activities as told by your health care provider. Ask your health care provider what activities are safe for you. °· Take over-the-counter and prescription medicines only as told by your health care provider. °· If you smoke, do not smoke without supervision. °· Keep all follow-up visits as told by your health care provider. This is important. °Contact a health care provider if: °· You have nausea or vomiting that does not get better with medicine. °· You cannot eat or drink without vomiting. °· You have pain that does not get better with medicine. °· You are unable to pass urine. °· You develop a skin rash. °· You have a fever. °· You have redness around your IV site that gets worse. °Get help right away if: °· You have difficulty breathing. °· You have chest pain. °· You have blood in your urine or stool, or you vomit blood. °Summary °· After the procedure, it is common to have a sore throat or nausea. It is also common to feel tired. °· Have a responsible adult stay with you for the first 24 hours after general anesthesia. It is important to have someone help care for you until you are awake   and alert. °· When you feel hungry, start by eating small amounts of foods that are soft and easy to digest (bland), such as toast. Gradually return to your regular diet. °· Drink enough fluid to keep your urine pale yellow. °· Return to your normal activities as told by your health care provider. Ask your health care provider what activities are safe for you. °This information is not intended to replace advice given to you by your  health care provider. Make sure you discuss any questions you have with your health care provider. °Document Revised: 05/03/2017 Document Reviewed: 12/14/2016 °Elsevier Patient Education © 2020 [employer] ° ° ° °

## 2019-12-14 NOTE — Op Note (Signed)
Preoperative diagnosis: Left proximal ureteral calculus  Postoperative diagnosis: Left renal calculus  Procedure:  1. Cystoscopy 2. Left ureteroscopy and stone removal 3. Left retrograde pyelography with interpretation  Surgeon: Pryor Curia. M.D.  Anesthesia: General  Complications: None  Intraoperative findings: Left retrograde pyelography demonstrated a filling defect within the left lower pole consistent with the patient's known calculus without other abnormalities noted.  A bifid collecting system was noted.  EBL: Minimal  Specimens: 1. Left renal calculus  Disposition of specimens: [facility] for stone analysis  Indication: Isabella Galloway  is a 55 y.o. patient with urolithiasis who had a symptomatic left ureteral stone with infection s/p stent. After reviewing the management options for treatment, they elected to proceed with the above surgical procedure(s). We have discussed the potential benefits and risks of the procedure, side effects of the proposed treatment, the likelihood of the patient achieving the goals of the procedure, and any potential problems that might occur during the procedure or recuperation. Informed consent has been obtained.  Description of procedure:  The patient was taken to the operating room and general anesthesia was induced.  The patient was placed in the dorsal lithotomy position, prepped and draped in the usual sterile fashion, and preoperative antibiotics were administered. A preoperative time-out was performed.   Cystourethroscopy was performed.  The patient's urethra was examined and was normal. The bladder was then systematically examined in its entirety. There was no evidence for any bladder tumors, stones, or other mucosal pathology.    Attention then turned to the left ureteral orifice and the indwelling stent was grasped and removed.   A 0.38 sensor guidewire was then advanced up the left ureter into the  renal pelvis under fluoroscopic guidance. A 6 Fr semirigid ureteroscope was then inserted next to the wire and the entire ureter was examined.  No stone was identified.  A glidewire was then paced. The digital flexible ureteroscope was then advanced over the glidewire into the ureter up to the renal collecting system.  The renal collecting system was examined and initially no stone was identified.  I then injected omnipaque contrast and this filled out what appeared to only be a mid and upper pole calyx.  I then retracted the ureteroscope and injected omnipaque again and filled out a bifid lower pole system.  A filling defect was located in the lower pole.  The ureteroscope was advanced into this calyx and the stone was identified.  The stone was removed with a 0 tip nitinol basket intact.  The wire was then removed.  No stent was needed as the ureter was very dilated from her indwelling stent over the past few weeks.  The bladder was then emptied and the procedure ended.  The patient appeared to tolerate the procedure well and without complications.  The patient was able to be awakened and transferred to the recovery unit in satisfactory condition.   Pryor Curia MD

## 2019-12-14 NOTE — Interval H&P Note (Signed)
History and Physical Interval Note:  12/14/2019 10:53 AM  Isabella Galloway  has presented today for surgery, with the diagnosis of LEFT URETERAL CALCULUS.  The various methods of treatment have been discussed with the patient and family. After consideration of risks, benefits and other options for treatment, the patient has consented to  Procedure(s): CYSTOSCOPY/RETROGRADE/URETEROSCOPY/HOLMIUM LASER/STENT PLACEMENT (Left) as a surgical intervention.  The patient's history has been reviewed, patient examined, no change in status, stable for surgery.  I have reviewed the patient's chart and labs.  Questions were answered to the patient's satisfaction.     [employer]

## 2019-12-14 NOTE — Anesthesia Postprocedure Evaluation (Signed)
Anesthesia Post Note  Patient: Isabella Galloway  Procedure(s) Performed: CYSTOSCOPY/RETROGRADE/URETEROSCOPY/STENT REMOVAL (Left )     Patient location during evaluation: PACU Anesthesia Type: General Level of consciousness: awake and alert Pain management: pain level controlled Vital Signs Assessment: post-procedure vital signs reviewed and stable Respiratory status: spontaneous breathing, nonlabored ventilation, respiratory function stable and patient connected to nasal cannula oxygen Cardiovascular status: blood pressure returned to baseline and stable Postop Assessment: no apparent nausea or vomiting Anesthetic complications: no   No complications documented.  Last Vitals:  Vitals:   12/14/19 1315 12/14/19 1341  BP: (!) 163/90 (!) 158/89  Pulse: (!) 104 (!) 106  Resp: 19 20  Temp:  36.5 C  SpO2: 92% 96%    Last Pain:  Vitals:   12/14/19 1350  TempSrc:   PainSc: 7                  Brynlea Spindler L Kale Dols

## 2019-12-14 NOTE — Anesthesia Procedure Notes (Signed)
Procedure Name: Intubation Date/Time: 12/14/2019 11:50 AM Performed by: Cynda Familia, CRNA Pre-anesthesia Checklist: Patient identified, Emergency Drugs available, Suction available and Patient being monitored Patient Re-evaluated:Patient Re-evaluated prior to induction Oxygen Delivery Method: Circle System Utilized Preoxygenation: Pre-oxygenation with 100% oxygen Induction Type: IV induction, Rapid sequence and Cricoid Pressure applied Laryngoscope Size: Miller and 2 Grade View: Grade IV Tube type: Oral Number of attempts: 1 Airway Equipment and Method: Stylet,  Oral airway and Video-laryngoscopy Placement Confirmation: ETT inserted through vocal cords under direct vision,  positive ETCO2 and breath sounds checked- equal and bilateral Secured at: 21 cm Tube secured with: Tape Dental Injury: Teeth and Oropharynx as per pre-operative assessment  Difficulty Due To: Difficult Airway- due to anterior larynx and Difficult Airway- due to limited oral opening Future Recommendations: Recommend- induction with short-acting agent, and alternative techniques readily available Comments: IV induction Woodrum--- RSI due to nausea and vomiting prior to induction-- anterior- poor view-- DL with Miller 2 x1--- then to Glidescope with good view-- + BS Bilat Woodrum-- DIFFICULT INTUBATION

## 2019-12-14 NOTE — Anesthesia Procedure Notes (Signed)
Date/Time: 12/14/2019 12:41 PM Performed by: Cynda Familia, CRNA Oxygen Delivery Method: Simple face mask Placement Confirmation: positive ETCO2 and breath sounds checked- equal and bilateral Dental Injury: Teeth and Oropharynx as per pre-operative assessment

## 2019-12-14 NOTE — OR Nursing (Signed)
Stone taken by Dr. Borden 

## 2019-12-15 ENCOUNTER — Encounter (HOSPITAL_COMMUNITY): Payer: Self-pay | Admitting: Urology

## 2020-08-19 ENCOUNTER — Other Ambulatory Visit: Payer: Self-pay | Admitting: Surgery

## 2020-08-19 DIAGNOSIS — R1011 Right upper quadrant pain: Secondary | ICD-10-CM

## 2020-09-07 ENCOUNTER — Other Ambulatory Visit: Payer: Self-pay

## 2020-09-07 ENCOUNTER — Ambulatory Visit
Admission: RE | Admit: 2020-09-07 | Discharge: 2020-09-07 | Disposition: A | Discharge status: Home / Self Care | Payer: Medicare Other | Source: Ambulatory Visit | Attending: Surgery | Admitting: Surgery

## 2020-09-07 DIAGNOSIS — R1011 Right upper quadrant pain: Secondary | ICD-10-CM

## 2020-09-16 ENCOUNTER — Ambulatory Visit: Payer: Self-pay | Admitting: Surgery

## 2020-09-16 NOTE — Progress Notes (Signed)
DUE TO COVID-19 ONLY ONE VISITOR IS ALLOWED TO COME WITH YOU AND STAY IN THE WAITING ROOM ONLY DURING PRE OP AND PROCEDURE DAY OF SURGERY. THE 1 VISITOR  MAY VISIT WITH YOU AFTER SURGERY IN YOUR PRIVATE ROOM DURING VISITING HOURS ONLY!  YOU NEED TO HAVE A COVID 19 TEST ON_5/02/2021 ______ @___1155am  ____, THIS TEST MUST BE DONE BEFORE SURGERY,  COVID TESTING SITE [address] [place] [address], IT IS ON THE RIGHT GOING OUT [address] APPROXIMATELY  2 MINUTES PAST [employer] ON THE RIGHT. ONCE YOUR COVID TEST IS COMPLETED,  PLEASE BEGIN THE QUARANTINE INSTRUCTIONS AS OUTLINED IN YOUR HANDOUT.                Isabella Galloway  09/16/2020   Your procedure is scheduled on:  09/22/2020   Report to [facility] Main  Entrance   Report to admitting at     0930am      Call this number if you have problems the morning of surgery [phone]    Remember: Do not eat food , candy gum or mints :After Midnight. You may have clear liquids from midnight until   0830am     CLEAR LIQUID DIET   Foods Allowed                                                                       Coffee and tea, regular and decaf                              Plain Jell-O any favor except red or purple                                            Fruit ices (not with fruit pulp)                                      Iced Popsicles                                     Carbonated beverages, regular and diet                                    Cranberry, grape and apple juices Sports drinks like Gatorade Lightly seasoned clear broth or consume(fat free) Sugar, honey syrup   _____________________________________________________________________    BRUSH YOUR TEETH MORNING OF SURGERY AND RINSE YOUR MOUTH OUT, NO CHEWING GUM CANDY OR MINTS.     Take these medicines the morning of surgery with A SIP OF WATER:  gabapentin DO NOT TAKE ANY DIABETIC MEDICATIONS DAY OF YOUR SURGERY                                You may not have any  metal on your body including hair pins and              piercings  Do not wear jewelry, make-up, lotions, powders or perfumes, deodorant             Do not wear nail polish on your fingernails.  Do not shave  48 hours prior to surgery.              Men may shave face and neck.   Do not bring valuables to the hospital. [place].  Contacts, dentures or bridgework may not be worn into surgery.  Leave suitcase in the car. After surgery it may be brought to your room.     Patients discharged the day of surgery will not be allowed to drive home. IF YOU ARE HAVING SURGERY AND GOING HOME THE SAME DAY, YOU MUST HAVE AN ADULT TO DRIVE YOU HOME AND BE WITH YOU FOR 24 HOURS. YOU MAY GO HOME BY TAXI OR UBER OR ORTHERWISE, BUT AN ADULT MUST ACCOMPANY YOU HOME AND STAY WITH YOU FOR 24 HOURS.  Name and phone number of your driver:  Special Instructions: N/A              Please read over the following fact sheets you were given: _____________________________________________________________________  [facility] - Preparing for Surgery Before surgery, you can play an important role.  Because skin is not sterile, your skin needs to be as free of germs as possible.  You can reduce the number of germs on your skin by washing with CHG (chlorahexidine gluconate) soap before surgery.  CHG is an antiseptic cleaner which kills germs and bonds with the skin to continue killing germs even after washing. Please DO NOT use if you have an allergy to CHG or antibacterial soaps.  If your skin becomes reddened/irritated stop using the CHG and inform your nurse when you arrive at Short Stay. Do not shave (including legs and underarms) for at least 48 hours prior to the first CHG shower.  You may shave your face/neck. Please follow these instructions carefully:  1.  Shower with CHG Soap the night before surgery and the  morning of  Surgery.  2.  If you choose to wash your hair, wash your hair first as usual with your  normal  shampoo.  3.  After you shampoo, rinse your hair and body thoroughly to remove the  shampoo.                           4.  Use CHG as you would any other liquid soap.  You can apply chg directly  to the skin and wash                       Gently with a scrungie or clean washcloth.  5.  Apply the CHG Soap to your body ONLY FROM THE NECK DOWN.   Do not use on face/ open                           Wound or open sores. Avoid contact with eyes, ears mouth and genitals (private parts).                       Wash face,  Genitals (private parts) with your normal soap.             6.  Wash thoroughly, paying special attention to the area where your surgery  will be performed.  7.  Thoroughly rinse your body with warm water from the neck down.  8.  DO NOT shower/wash with your normal soap after using and rinsing off  the CHG Soap.                9.  Pat yourself dry with a clean towel.            10.  Wear clean pajamas.            11.  Place clean sheets on your bed the night of your first shower and do not  sleep with pets. Day of Surgery : Do not apply any lotions/deodorants the morning of surgery.  Please wear clean clothes to the hospital/surgery center.  FAILURE TO FOLLOW THESE INSTRUCTIONS MAY RESULT IN THE CANCELLATION OF YOUR SURGERY PATIENT SIGNATURE_________________________________  NURSE SIGNATURE__________________________________  ________________________________________________________________________

## 2020-09-19 ENCOUNTER — Other Ambulatory Visit (HOSPITAL_COMMUNITY): Payer: Medicare Other

## 2020-09-20 ENCOUNTER — Encounter (HOSPITAL_COMMUNITY)
Admission: RE | Admit: 2020-09-20 | Discharge: 2020-09-20 | Disposition: A | Discharge status: Home / Self Care | Payer: Medicare Other | Source: Ambulatory Visit | Attending: Surgery | Admitting: Surgery

## 2020-09-20 ENCOUNTER — Other Ambulatory Visit: Payer: Self-pay

## 2020-09-20 ENCOUNTER — Other Ambulatory Visit (HOSPITAL_COMMUNITY)
Admission: RE | Admit: 2020-09-20 | Discharge: 2020-09-20 | Disposition: A | Discharge status: Home / Self Care | Payer: Medicare Other | Source: Ambulatory Visit | Attending: Surgery | Admitting: Surgery

## 2020-09-20 ENCOUNTER — Encounter (HOSPITAL_COMMUNITY): Payer: Self-pay | Admitting: Surgery

## 2020-09-20 DIAGNOSIS — Z20822 Contact with and (suspected) exposure to covid-19: Secondary | ICD-10-CM | POA: Insufficient documentation

## 2020-09-20 DIAGNOSIS — Z01812 Encounter for preprocedural laboratory examination: Secondary | ICD-10-CM | POA: Insufficient documentation

## 2020-09-20 LAB — BASIC METABOLIC PANEL
Anion gap: 6 (ref 5–15)
BUN: 26 mg/dL — ABNORMAL HIGH (ref 6–20)
CO2: 28 mmol/L (ref 22–32)
Calcium: 9.9 mg/dL (ref 8.9–10.3)
Chloride: 105 mmol/L (ref 98–111)
Creatinine, Ser: 0.53 mg/dL (ref 0.44–1.00)
GFR, Estimated: >60 mL/min (ref >60)
Glucose, Bld: 265 mg/dL — ABNORMAL HIGH (ref 70–99)
Potassium: 5.2 mmol/L — ABNORMAL HIGH (ref 3.5–5.1)
Sodium: 139 mmol/L (ref 135–145)

## 2020-09-20 LAB — HEMOGLOBIN A1C
Hgb A1c MFr Bld: 9.8 % — ABNORMAL HIGH (ref 4.8–5.6)
Mean Plasma Glucose: 234.56 mg/dL

## 2020-09-20 LAB — GLUCOSE, CAPILLARY: Glucose-Capillary: 255 mg/dL — ABNORMAL HIGH (ref 70–99)

## 2020-09-20 LAB — CBC
HCT: 47.3 % — ABNORMAL HIGH (ref 36.0–46.0)
Hemoglobin: 14.8 g/dL (ref 12.0–15.0)
MCH: 29.1 pg (ref 26.0–34.0)
MCHC: 31.3 g/dL (ref 30.0–36.0)
MCV: 92.9 fL (ref 80.0–100.0)
Platelets: 233 10*3/uL (ref 150–400)
RBC: 5.09 MIL/uL (ref 3.87–5.11)
RDW: 13.2 % (ref 11.5–15.5)
WBC: 7.7 10*3/uL (ref 4.0–10.5)
nRBC: 0 % (ref 0.0–0.2)

## 2020-09-20 LAB — SARS CORONAVIRUS 2 (TAT 6-24 HRS): SARS Coronavirus 2: NEGATIVE

## 2020-09-20 NOTE — Progress Notes (Addendum)
Anesthesia Review:  PCP: DR Elisha Ponder with [facility] in [place] 06/30/20 Encocdrinology- 07/13/20- LOV - D rdiaz   Cardiologist :  DR  C[place]uri- [place] 10/04/2016  Chest x-ray : EKG :11/17/2019  Echo : 2018  Stress test: 2018  Cardiac Cath :  Activity level:  Can doa  Fight of stairs without difficulty  Sleep Study/ CPAP : none  Fasting Blood Sugar :      / Checks Blood Sugar -- times a day:   Blood Thinner/ Instructions /Last Dose: ASA / Instructions/ Last Dose :  Checks glucose once daily at home  Glucose 255 at preop hgba1c-06/30/20-7.8 hgba1c-09/20/2020- 9.8 routed to DR Zenia Resides.  BMP done 09/20/20 routed to Dr Zenia Resides.

## 2020-09-22 ENCOUNTER — Encounter (HOSPITAL_COMMUNITY): Payer: Self-pay | Admitting: Surgery

## 2020-09-22 ENCOUNTER — Encounter (HOSPITAL_COMMUNITY)
Admission: RE | Disposition: A | Discharge status: Home / Self Care | Payer: Self-pay | Source: Ambulatory Visit | Attending: Surgery

## 2020-09-22 ENCOUNTER — Ambulatory Visit (HOSPITAL_COMMUNITY)
Admission: RE | Admit: 2020-09-22 | Discharge: 2020-09-22 | Disposition: A | Discharge status: Home / Self Care | Payer: Medicare Other | Source: Ambulatory Visit | Attending: Surgery | Admitting: Surgery

## 2020-09-22 ENCOUNTER — Ambulatory Visit (HOSPITAL_COMMUNITY): Payer: Medicare Other | Admitting: Physician Assistant

## 2020-09-22 ENCOUNTER — Ambulatory Visit (HOSPITAL_COMMUNITY): Payer: Medicare Other | Admitting: Anesthesiology

## 2020-09-22 DIAGNOSIS — E1142 Type 2 diabetes mellitus with diabetic polyneuropathy: Secondary | ICD-10-CM | POA: Diagnosis not present

## 2020-09-22 DIAGNOSIS — Z791 Long term (current) use of non-steroidal anti-inflammatories (NSAID): Secondary | ICD-10-CM | POA: Diagnosis not present

## 2020-09-22 DIAGNOSIS — K219 Gastro-esophageal reflux disease without esophagitis: Secondary | ICD-10-CM | POA: Diagnosis not present

## 2020-09-22 DIAGNOSIS — Z8249 Family history of ischemic heart disease and other diseases of the circulatory system: Secondary | ICD-10-CM | POA: Diagnosis not present

## 2020-09-22 DIAGNOSIS — K8020 Calculus of gallbladder without cholecystitis without obstruction: Secondary | ICD-10-CM | POA: Diagnosis present

## 2020-09-22 DIAGNOSIS — Z809 Family history of malignant neoplasm, unspecified: Secondary | ICD-10-CM | POA: Diagnosis not present

## 2020-09-22 DIAGNOSIS — Z794 Long term (current) use of insulin: Secondary | ICD-10-CM | POA: Insufficient documentation

## 2020-09-22 DIAGNOSIS — Z801 Family history of malignant neoplasm of trachea, bronchus and lung: Secondary | ICD-10-CM | POA: Diagnosis not present

## 2020-09-22 DIAGNOSIS — K8010 Calculus of gallbladder with chronic cholecystitis without obstruction: Secondary | ICD-10-CM | POA: Diagnosis not present

## 2020-09-22 DIAGNOSIS — Z803 Family history of malignant neoplasm of breast: Secondary | ICD-10-CM | POA: Insufficient documentation

## 2020-09-22 DIAGNOSIS — Z825 Family history of asthma and other chronic lower respiratory diseases: Secondary | ICD-10-CM | POA: Insufficient documentation

## 2020-09-22 DIAGNOSIS — E11319 Type 2 diabetes mellitus with unspecified diabetic retinopathy without macular edema: Secondary | ICD-10-CM | POA: Insufficient documentation

## 2020-09-22 DIAGNOSIS — Z79899 Other long term (current) drug therapy: Secondary | ICD-10-CM | POA: Insufficient documentation

## 2020-09-22 HISTORY — PX: CHOLECYSTECTOMY: SHX55

## 2020-09-22 LAB — GLUCOSE, CAPILLARY
Glucose-Capillary: 252 mg/dL — ABNORMAL HIGH (ref 70–99)
Glucose-Capillary: 288 mg/dL — ABNORMAL HIGH (ref 70–99)
Glucose-Capillary: 318 mg/dL — ABNORMAL HIGH (ref 70–99)
Glucose-Capillary: 235 mg/dL — ABNORMAL HIGH (ref 70–99)

## 2020-09-22 SURGERY — LAPAROSCOPIC CHOLECYSTECTOMY
Anesthesia: General | Site: Abdomen

## 2020-09-22 MED ORDER — FENTANYL CITRATE (PF) 100 MCG/2ML IJ SOLN
INTRAMUSCULAR | Status: AC
  Administered 2020-09-22: 50 ug via INTRAVENOUS
  Filled 2020-09-22: qty 2

## 2020-09-22 MED ORDER — GABAPENTIN 300 MG PO CAPS
ORAL_CAPSULE | ORAL | Status: AC
  Filled 2020-09-22: qty 1

## 2020-09-22 MED ORDER — AMISULPRIDE (ANTIEMETIC) 5 MG/2ML IV SOLN
INTRAVENOUS | Status: AC
  Administered 2020-09-22: 10 mg via INTRAVENOUS
  Filled 2020-09-22: qty 4

## 2020-09-22 MED ORDER — ACETAMINOPHEN 500 MG PO TABS
ORAL_TABLET | ORAL | Status: AC
  Filled 2020-09-22: qty 2

## 2020-09-22 MED ORDER — MIDAZOLAM HCL 5 MG/5ML IJ SOLN
INTRAMUSCULAR | Status: DC | PRN
  Administered 2020-09-22: 1 mg via INTRAVENOUS

## 2020-09-22 MED ORDER — SUGAMMADEX SODIUM 200 MG/2ML IV SOLN
INTRAVENOUS | Status: DC | PRN
  Administered 2020-09-22: 400 mg via INTRAVENOUS

## 2020-09-22 MED ORDER — FENTANYL CITRATE (PF) 100 MCG/2ML IJ SOLN
INTRAMUSCULAR | Status: AC
  Filled 2020-09-22: qty 2

## 2020-09-22 MED ORDER — ACETAMINOPHEN 500 MG PO TABS: 1000.0000 mg | ORAL_TABLET | ORAL | Status: DC

## 2020-09-22 MED ORDER — FENTANYL CITRATE (PF) 100 MCG/2ML IJ SOLN
25.0000 ug | INTRAMUSCULAR | Status: DC | PRN
  Administered 2020-09-22: 50 ug via INTRAVENOUS

## 2020-09-22 MED ORDER — IOHEXOL 300 MG/ML  SOLN: 50.0000 mL | Freq: Once | INTRAMUSCULAR | Status: DC | PRN

## 2020-09-22 MED ORDER — PROPOFOL 10 MG/ML IV BOLUS
INTRAVENOUS | Status: DC | PRN
  Administered 2020-09-22: 150 mg via INTRAVENOUS

## 2020-09-22 MED ORDER — BUPIVACAINE-EPINEPHRINE (PF) 0.25% -1:200000 IJ SOLN
INTRAMUSCULAR | Status: AC
  Filled 2020-09-22: qty 30

## 2020-09-22 MED ORDER — CEFAZOLIN SODIUM-DEXTROSE 2-4 GM/100ML-% IV SOLN
2.0000 g | INTRAVENOUS | Status: AC
  Administered 2020-09-22: 2 g via INTRAVENOUS

## 2020-09-22 MED ORDER — BUPIVACAINE-EPINEPHRINE 0.25% -1:200000 IJ SOLN
INTRAMUSCULAR | Status: DC | PRN
  Administered 2020-09-22: 30 mL

## 2020-09-22 MED ORDER — INSULIN ASPART 100 UNIT/ML IJ SOLN
INTRAMUSCULAR | Status: AC
  Filled 2020-09-22: qty 1

## 2020-09-22 MED ORDER — ONDANSETRON HCL 4 MG/2ML IJ SOLN
INTRAMUSCULAR | Status: AC
  Filled 2020-09-22: qty 2

## 2020-09-22 MED ORDER — LACTATED RINGERS IV SOLN
INTRAVENOUS | Status: DC | PRN
  Administered 2020-09-22: 1000 mL

## 2020-09-22 MED ORDER — OXYCODONE HCL 5 MG PO TABS
5.0000 mg | ORAL_TABLET | Freq: Once | ORAL | Status: AC
  Administered 2020-09-22: 5 mg via ORAL

## 2020-09-22 MED ORDER — ACETAMINOPHEN 10 MG/ML IV SOLN
1000.0000 mg | Freq: Once | INTRAVENOUS | Status: AC
  Administered 2020-09-22: 1000 mg via INTRAVENOUS

## 2020-09-22 MED ORDER — INSULIN ASPART 100 UNIT/ML IJ SOLN
INTRAMUSCULAR | Status: AC
  Administered 2020-09-22: 5 [IU] via SUBCUTANEOUS
  Filled 2020-09-22: qty 1

## 2020-09-22 MED ORDER — LIDOCAINE 2% (20 MG/ML) 5 ML SYRINGE
INTRAMUSCULAR | Status: AC
  Filled 2020-09-22: qty 5

## 2020-09-22 MED ORDER — LIDOCAINE 2% (20 MG/ML) 5 ML SYRINGE
INTRAMUSCULAR | Status: DC | PRN
  Administered 2020-09-22: 80 mg via INTRAVENOUS

## 2020-09-22 MED ORDER — ONDANSETRON HCL 4 MG/2ML IJ SOLN
INTRAMUSCULAR | Status: DC | PRN
  Administered 2020-09-22: 4 mg via INTRAVENOUS

## 2020-09-22 MED ORDER — GABAPENTIN 300 MG PO CAPS: 300.0000 mg | ORAL_CAPSULE | ORAL | Status: DC

## 2020-09-22 MED ORDER — ROCURONIUM BROMIDE 10 MG/ML (PF) SYRINGE
PREFILLED_SYRINGE | INTRAVENOUS | Status: AC
  Filled 2020-09-22: qty 10

## 2020-09-22 MED ORDER — OXYCODONE HCL 5 MG PO TABS
ORAL_TABLET | ORAL | Status: AC
  Filled 2020-09-22: qty 1

## 2020-09-22 MED ORDER — MIDAZOLAM HCL 2 MG/2ML IJ SOLN
INTRAMUSCULAR | Status: AC
  Filled 2020-09-22: qty 2

## 2020-09-22 MED ORDER — HYDROCODONE-ACETAMINOPHEN 5-325 MG PO TABS: 1.0000 | ORAL_TABLET | Freq: Four times a day (QID) | ORAL | 0 refills | Status: DC | PRN

## 2020-09-22 MED ORDER — LACTATED RINGERS IV SOLN: INTRAVENOUS | Status: DC

## 2020-09-22 MED ORDER — INSULIN ASPART 100 UNIT/ML IJ SOLN
5.0000 [IU] | Freq: Once | INTRAMUSCULAR | Status: AC
  Administered 2020-09-22: 5 [IU] via SUBCUTANEOUS

## 2020-09-22 MED ORDER — PROPOFOL 10 MG/ML IV BOLUS
INTRAVENOUS | Status: AC
  Filled 2020-09-22: qty 20

## 2020-09-22 MED ORDER — DEXAMETHASONE SODIUM PHOSPHATE 10 MG/ML IJ SOLN
INTRAMUSCULAR | Status: DC | PRN
  Administered 2020-09-22: 8 mg via INTRAVENOUS

## 2020-09-22 MED ORDER — INSULIN ASPART 100 UNIT/ML IJ SOLN: 5.0000 [IU] | Freq: Once | INTRAMUSCULAR | Status: AC

## 2020-09-22 MED ORDER — CEFAZOLIN SODIUM-DEXTROSE 2-4 GM/100ML-% IV SOLN
INTRAVENOUS | Status: AC
  Filled 2020-09-22: qty 100

## 2020-09-22 MED ORDER — PHENYLEPHRINE 40 MCG/ML (10ML) SYRINGE FOR IV PUSH (FOR BLOOD PRESSURE SUPPORT)
PREFILLED_SYRINGE | INTRAVENOUS | Status: DC | PRN
  Administered 2020-09-22: 160 ug via INTRAVENOUS

## 2020-09-22 MED ORDER — AMISULPRIDE (ANTIEMETIC) 5 MG/2ML IV SOLN: 10.0000 mg | Freq: Once | INTRAVENOUS | Status: AC

## 2020-09-22 MED ORDER — ACETAMINOPHEN 10 MG/ML IV SOLN
INTRAVENOUS | Status: AC
  Filled 2020-09-22: qty 100

## 2020-09-22 MED ORDER — CHLORHEXIDINE GLUCONATE 0.12 % MT SOLN
15.0000 mL | Freq: Once | OROMUCOSAL | Status: AC
  Administered 2020-09-22: 15 mL via OROMUCOSAL

## 2020-09-22 MED ORDER — DEXAMETHASONE SODIUM PHOSPHATE 10 MG/ML IJ SOLN
INTRAMUSCULAR | Status: AC
  Filled 2020-09-22: qty 1

## 2020-09-22 MED ORDER — ROCURONIUM BROMIDE 10 MG/ML (PF) SYRINGE
PREFILLED_SYRINGE | INTRAVENOUS | Status: DC | PRN
  Administered 2020-09-22: 100 mg via INTRAVENOUS

## 2020-09-22 MED ORDER — FENTANYL CITRATE (PF) 250 MCG/5ML IJ SOLN
INTRAMUSCULAR | Status: DC | PRN
  Administered 2020-09-22: 25 ug via INTRAVENOUS
  Administered 2020-09-22 (×3): 50 ug via INTRAVENOUS
  Administered 2020-09-22: 25 ug via INTRAVENOUS

## 2020-09-22 MED ORDER — 0.9 % SODIUM CHLORIDE (POUR BTL) OPTIME
TOPICAL | Status: DC | PRN
  Administered 2020-09-22: 1000 mL

## 2020-09-22 MED ORDER — ORAL CARE MOUTH RINSE: 15.0000 mL | Freq: Once | OROMUCOSAL | Status: AC

## 2020-09-22 MED ORDER — ACETAMINOPHEN 500 MG PO TABS: 1000.0000 mg | ORAL_TABLET | Freq: Once | ORAL | Status: DC

## 2020-09-22 SURGICAL SUPPLY — 51 items
ADH SKN CLS APL DERMABOND .7 (GAUZE/BANDAGES/DRESSINGS) ×2
APL PRP STRL LF DISP 70% ISPRP (MISCELLANEOUS) ×2
APL SWBSTK 6 STRL LF DISP (MISCELLANEOUS)
APPLICATOR COTTON TIP 6 STRL (MISCELLANEOUS) IMPLANT
APPLICATOR COTTON TIP 6IN STRL (MISCELLANEOUS)
APPLIER CLIP 5 13 M/L LIGAMAX5 (MISCELLANEOUS) ×3
APR CLP MED LRG 5 ANG JAW (MISCELLANEOUS) ×2
BAG SPEC RTRVL LRG 6X4 10 (ENDOMECHANICALS) ×2
CABLE HIGH FREQUENCY MONO STRZ (ELECTRODE) IMPLANT
CHLORAPREP W/TINT 26 (MISCELLANEOUS) ×3 IMPLANT
CLIP APPLIE 5 13 M/L LIGAMAX5 (MISCELLANEOUS) ×2 IMPLANT
COVER MAYO STAND STRL (DRAPES) IMPLANT
COVER SURGICAL LIGHT HANDLE (MISCELLANEOUS) ×3 IMPLANT
COVER WAND RF STERILE (DRAPES) IMPLANT
DECANTER SPIKE VIAL GLASS SM (MISCELLANEOUS) ×3 IMPLANT
DERMABOND ADVANCED (GAUZE/BANDAGES/DRESSINGS) ×1
DERMABOND ADVANCED .7 DNX12 (GAUZE/BANDAGES/DRESSINGS) ×2 IMPLANT
DRAPE C-ARM 42X120 X-RAY (DRAPES) IMPLANT
DRAPE UTILITY XL STRL (DRAPES) ×3 IMPLANT
ELECT L-HOOK LAP 45CM DISP (ELECTROSURGICAL)
ELECT PENCIL ROCKER SW 15FT (MISCELLANEOUS) ×3 IMPLANT
ELECT REM PT RETURN 15FT ADLT (MISCELLANEOUS) ×3 IMPLANT
ELECTRODE L-HOOK LAP 45CM DISP (ELECTROSURGICAL) IMPLANT
GLOVE SURG POLYISO LF SZ5.5 (GLOVE) ×3 IMPLANT
GLOVE SURG UNDER POLY LF SZ6 (GLOVE) ×3 IMPLANT
GOWN STRL REUS W/TWL LRG LVL3 (GOWN DISPOSABLE) ×3 IMPLANT
GOWN STRL REUS W/TWL XL LVL3 (GOWN DISPOSABLE) ×6 IMPLANT
GRASPER SUT TROCAR 14GX15 (MISCELLANEOUS) IMPLANT
HEMOSTAT SNOW SURGICEL 2X4 (HEMOSTASIS) IMPLANT
KIT BASIN OR (CUSTOM PROCEDURE TRAY) ×3 IMPLANT
KIT TURNOVER KIT A (KITS) ×3 IMPLANT
L-HOOK LAP DISP 36CM (ELECTROSURGICAL) ×3
LHOOK LAP DISP 36CM (ELECTROSURGICAL) ×2 IMPLANT
NDL INSUFFLATION 14GA 120MM (NEEDLE) IMPLANT
NEEDLE HYPO 22GX1.5 SAFETY (NEEDLE) ×3 IMPLANT
NEEDLE INSUFFLATION 14GA 120MM (NEEDLE) IMPLANT
POUCH SPECIMEN RETRIEVAL 10MM (ENDOMECHANICALS) ×3 IMPLANT
SCISSORS LAP 5X35 DISP (ENDOMECHANICALS) ×3 IMPLANT
SET CHOLANGIOGRAPH MIX (MISCELLANEOUS) IMPLANT
SET IRRIG TUBING LAPAROSCOPIC (IRRIGATION / IRRIGATOR) ×3 IMPLANT
SET TUBE SMOKE EVAC HIGH FLOW (TUBING) ×3 IMPLANT
SLEEVE XCEL OPT CAN 5 100 (ENDOMECHANICALS) ×6 IMPLANT
SOL ANTI FOG 6CC (MISCELLANEOUS) ×2 IMPLANT
SOLUTION ANTI FOG 6CC (MISCELLANEOUS) ×1
SUT MNCRL AB 4-0 PS2 18 (SUTURE) ×3 IMPLANT
TOWEL OR 17X26 10 PK STRL BLUE (TOWEL DISPOSABLE) ×3 IMPLANT
TOWEL OR NON WOVEN STRL DISP B (DISPOSABLE) IMPLANT
TRAY LAPAROSCOPIC (CUSTOM PROCEDURE TRAY) ×3 IMPLANT
TROCAR BLADELESS OPT 5 100 (ENDOMECHANICALS) ×3 IMPLANT
TROCAR XCEL 12X100 BLDLESS (ENDOMECHANICALS) IMPLANT
TROCAR XCEL BLUNT TIP 100MML (ENDOMECHANICALS) IMPLANT

## 2020-09-22 NOTE — Transfer of Care (Signed)
Immediate Anesthesia Transfer of Care Note  Patient: Isabella Galloway  Procedure(s) Performed: LAPAROSCOPIC CHOLECYSTECTOMY (N/A Abdomen)  Patient Location: PACU  Anesthesia Type:General  Level of Consciousness: drowsy  Airway & Oxygen Therapy: Patient Spontanous Breathing and Patient connected to face mask oxygen  Post-op Assessment: Report given to RN and Post -op Vital signs reviewed and stable  Post vital signs: Reviewed and stable  Last Vitals:  Vitals Value Taken Time  BP 198/80 09/22/20 1246  Temp    Pulse 89 09/22/20 1248  Resp 23 09/22/20 1248  SpO2 98 % 09/22/20 1248  Vitals shown include unvalidated device data.  Last Pain:  Vitals:   09/22/20 1025  TempSrc: Oral  PainSc:          Complications: No complications documented.

## 2020-09-22 NOTE — Discharge Instructions (Signed)
[facility] [place] DISCHARGE INSTRUCTIONS  Activity . No heavy lifting greater than 10 pounds for 4 weeks after surgery. Madaline Brilliant to shower, but do not bathe or submerge incisions underwater. . Do not drive while taking narcotic pain medication.  Wound Care . Your incisions are covered with skin glue called Dermabond. This will peel off on its own over time. . You may shower in 48 hours and allow warm soapy water to run over your incisions. Gently pat dry. . Do not submerge your incision underwater. . Monitor your incision for any new redness, tenderness, or drainage.  When to Call us: Marland Kitchen Fever greater than 100.5 . New redness, drainage, or swelling at incision site . Severe pain, nausea, or vomiting . Jaundice (yellowing of the whites of the eyes or skin)  Follow-up You have an appointment scheduled with Dr. Zenia Resides on October 12, 2020 at 3:30pm. This will be at the [facility] Surgery office at [address] [place], [place], [place]. Please arrive at least 15 minutes prior to your scheduled appointment time.  For questions or concerns, please call the office at [phone].

## 2020-09-22 NOTE — Op Note (Signed)
Date: 09/22/20  Patient: Isabella Galloway MRN: [id number]  Preoperative Diagnosis: Biliary colic Postoperative Diagnosis: Same  Procedure: Laparoscopic cholecystectomy  Surgeon: Michaelle Birks, MD  EBL: Minimal  Anesthesia: General  Specimens: Gallbladder  Indications: Ms. Shaft is a 55 yo female who presented with epigastric and RUQ abdominal pain. She had previously undergone extensive workup and RUQ US showed sludge and small gallstones. She elected to proceed with cholecystectomy.  Findings: No evidence of acute cholecystitis.  Procedure details: Informed consent was obtained in the preoperative area prior to the procedure. The patient was brought to the operating room and placed on the table in the supine position. General anesthesia was induced and appropriate lines and drains were placed for intraoperative monitoring. Perioperative antibiotics were administered per SCIP guidelines. The abdomen was prepped and draped in the usual sterile fashion. A pre-procedure timeout was taken verifying patient identity, surgical site and procedure to be performed.  A small supraumbilical skin incision was made, the subcutaneous tissue was divided with cautery, and the umbilical stalk was grasped and elevated. The fascia was incised and the peritoneal cavity was directly visualized. A 71mm Hassan trocar was placed. The peritoneal cavity was inspected with no evidence of visceral or vascular injury. Three 48mm ports were placed in the right subcostal margin, all under direct visualization. The fundus of the gallbladder was grasped and retracted cephalad. The infundibulum was retracted laterally. The cystic triangle was dissected out using cautery and blunt dissection, and the critical view of safety was obtained. The cystic duct and cystic artery were clipped and ligated. The gallbladder was taken off the liver using cautery. The specimen was placed in an endocatch bag and removed. The surgical site  was irrigated with saline until the effluent was clear. Hemostasis was achieved in the gallbladder fossa using cautery. The cystic duct and artery stumps were visually inspected and there was no evidence of bile leak or bleeding. The ports were removed under direct visualization and the abdomen was desufflated. The umbilical port site fascia was closed with a 0 vicryl suture. The skin at all port sites was closed with 4-0 monocryl subcuticular suture. Dermabond was applied.  The patient tolerated the procedure well with no apparent complications. All counts were correct x2 at the end of the procedure. The patient was extubated and taken to PACU in stable condition.  Michaelle Birks, MD 09/22/20 12:48 PM

## 2020-09-22 NOTE — Anesthesia Procedure Notes (Signed)
Procedure Name: Intubation Performed by: Milford Cage, CRNA Pre-anesthesia Checklist: Patient identified, Emergency Drugs available, Suction available and Patient being monitored Patient Re-evaluated:Patient Re-evaluated prior to induction Oxygen Delivery Method: Circle system utilized Preoxygenation: Pre-oxygenation with 100% oxygen Induction Type: IV induction Ventilation: Mask ventilation without difficulty Laryngoscope Size: Glidescope and 3 Grade View: Grade I Tube type: Oral Tube size: 7.0 mm Number of attempts: 1 Airway Equipment and Method: Stylet Placement Confirmation: ETT inserted through vocal cords under direct vision,  positive ETCO2 and breath sounds checked- equal and bilateral Secured at: 22 cm Tube secured with: Tape Dental Injury: Teeth and Oropharynx as per pre-operative assessment

## 2020-09-22 NOTE — Anesthesia Preprocedure Evaluation (Addendum)
Anesthesia Evaluation  Patient identified by MRN, date of birth, ID band Patient awake    Reviewed: Allergy & Precautions, NPO status , Patient's Chart, lab work & pertinent test results  Airway Mallampati: IV  TM Distance: >3 FB Neck ROM: Full  Mouth opening: Limited Mouth Opening  Dental no notable dental hx. (+) Teeth Intact, Dental Advisory Given   Pulmonary neg pulmonary ROS,    Pulmonary exam normal breath sounds clear to auscultation       Cardiovascular + CAD  Normal cardiovascular exam Rhythm:Regular Rate:Normal  HLD  TTE 2018 Normal EF, valves ok  Stress Test 2018 Nuclear stress EF: 65%. The left ventricular ejection fraction is normal (55-65%). There was no ST segment deviation noted during stress. The study is normal. This is a low risk study.     Neuro/Psych PSYCHIATRIC DISORDERS Anxiety Depression negative neurological ROS     GI/Hepatic Neg liver ROS, GERD  Controlled,  Endo/Other  negative endocrine ROSdiabetes, Type 2, Insulin Dependent  Renal/GU negative Renal ROS  negative genitourinary   Musculoskeletal  (+) Arthritis ,   Abdominal   Peds  Hematology negative hematology ROS (+)   Anesthesia Other Findings   Reproductive/Obstetrics                           Anesthesia Physical Anesthesia Plan  ASA: III  Anesthesia Plan: General   Post-op Pain Management:    Induction: Intravenous  PONV Risk Score and Plan: 3 and Midazolam, Dexamethasone and Ondansetron  Airway Management Planned: Oral ETT and Video Laryngoscope Planned  Additional Equipment:   Intra-op Plan:   Post-operative Plan: Extubation in OR  Informed Consent: I have reviewed the patients History and Physical, chart, labs and discussed the procedure including the risks, benefits and alternatives for the proposed anesthesia with the patient or authorized representative who has indicated  his/her understanding and acceptance.     Dental advisory given  Plan Discussed with: CRNA  Anesthesia Plan Comments:        Anesthesia Quick Evaluation

## 2020-09-22 NOTE — H&P (Signed)
Isabella Galloway is an 55 y.o. female.   Chief Complaint: abdominal pain HPI: Isabella Galloway is a 55 yo female who was referred for epigastric and RUQ abdominal pain. She had a RUQ [place] that showed some sludge and small gallstones. She has continued to have pain in the RUQ with radiation to the back. She presents today for surgery. Prior LFTs have been normal. COVID negative on 09/20/20.  Past Medical History:  Diagnosis Date  . Anxiety   . Arthritis    back, neck   . Depression   . Diabetes mellitus without complication ([place])    type 2   . Dyspnea    with exertion   . Family history of breast cancer   . Genetic testing 04/11/2017   Multi-Cancer panel (83 genes) @ [facility] - No pathogenic mutations detected  . GERD (gastroesophageal reflux disease)   . History of kidney stones    passed stone - no surgery  . Hyperlipidemia   . 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 Retinopathy    gets shot every 6-8 weeks both eyes  . Treadmill stress test negative for angina pectoris    NORMAL PER PATIENT test done for high colesterol    Past Surgical History:  Procedure Laterality Date  . BREAST SURGERY Left    benign with metal clip  . COLONOSCOPY     polyp  . CYSTOSCOPY WITH STENT PLACEMENT Left 11/14/2019   Procedure: CYSTOSCOPY AND LEFT URETERAL WITH STENT PLACEMENT retrograde pylegram;  Surgeon: Raynelle Bring, MD;  Location: [facility];  Service: Urology;  Laterality: Left;  . CYSTOSCOPY/URETEROSCOPY/HOLMIUM LASER/STENT PLACEMENT Left 12/14/2019   Procedure: CYSTOSCOPY/RETROGRADE/URETEROSCOPY/STENT REMOVAL;  Surgeon: Raynelle Bring, MD;  Location: [facility];  Service: Urology;  Laterality: Left;  . INCISION AND DRAINAGE FOOT Right 2019  . LAPAROSCOPIC BILATERAL SALPINGO OOPHERECTOMY Bilateral 03/06/2017   Procedure: LAPAROSCOPIC Left Salpingectomy & OOPHORECTOMY, Right Oophorectomy;  Surgeon: Janyth Pupa, DO;  Location: [facility];  Service: Gynecology;  Laterality:  Bilateral;  . PILONIDAL CYST DRAINAGE  1980  . tailbone fx & subsequent cyst with excision    . UPPER GI ENDOSCOPY    . WISDOM TOOTH EXTRACTION      Family History  Problem Relation Age of Onset  . Heart failure Mother   . COPD Mother   . Cancer Mother        lung cancer  . Other Mother        pituitary tumor  . Breast cancer Maternal Grandmother 76       deceased 35  . Heart attack Maternal Grandfather   . Cancer Other        mat granmother's sisters: one with leukemia and 2 with kidney ca at older ages   Social History:  reports that she has never smoked. She has never used smokeless tobacco. She reports that she does not drink alcohol and does not use drugs.  Allergies: No Known Allergies  Medications Prior to Admission  Medication Sig Dispense Refill  . bifidobacterium infantis (ALIGN) capsule Take 1 capsule by mouth daily.    Marland Kitchen docusate sodium (COLACE) 100 MG capsule Take 1 capsule (100 mg total) by mouth 2 (two) times daily. (Patient taking differently: Take 100 mg by mouth daily.) 30 capsule 0  . furosemide (LASIX) 20 MG tablet Take 20 mg by mouth.    . gabapentin (NEURONTIN) 600 MG tablet Take 600 mg by mouth 3 (three) times daily.    Marland Kitchen  insulin degludec (TRESIBA FLEXTOUCH) 100 UNIT/ML FlexTouch Pen Inject 32 Units into the skin daily.    . insulin lispro (HUMALOG) 100 UNIT/ML KwikPen Inject 10-20 Units into the skin in the morning and at bedtime. Per sliding scale  Lunch and supper    . omeprazole (PRILOSEC) 40 MG capsule Take 40 mg by mouth daily as needed (indigestion).     . ondansetron (ZOFRAN) 4 MG tablet Take 4 mg by mouth every 8 (eight) hours as needed for nausea or vomiting.     Vladimir Faster Glyc-Propyl Glyc PF 0.4-0.3 % SOLN Apply 1-2 drops to eye 2 (two) times daily as needed (dryness).    . rosuvastatin (CRESTOR) 10 MG tablet Take 1 tablet (10 mg total) by mouth daily. NEED OV. 90 tablet 0  . Vitamin D, Ergocalciferol, (DRISDOL) 1.25 MG (50000 UNIT) CAPS  capsule Take 50,000 Units by mouth every 14 (fourteen) days.    . cyclobenzaprine (FLEXERIL) 10 MG tablet Take 10 mg by mouth 3 (three) times daily as needed for muscle spasms.    Marland Kitchen HYDROcodone-acetaminophen (NORCO/VICODIN) 5-325 MG tablet Take 1 tablet by mouth every 6 (six) hours as needed for moderate pain or severe pain.     . Melatonin 5 MG CHEW Chew 5 mg by mouth at bedtime as needed (sleep).     . meloxicam (MOBIC) 15 MG tablet Take 15 mg by mouth daily as needed for pain.     . traZODone (DESYREL) 50 MG tablet Take 50 mg by mouth at bedtime as needed for sleep.       Results for orders placed or performed during the hospital encounter of 09/22/20 (from the past 48 hour(s))  Glucose, capillary     Status: Abnormal   Collection Time: 09/22/20 10:17 AM  Result Value Ref Range   Glucose-Capillary 252 (H) 70 - 99 mg/dL    Comment: Glucose reference range applies only to samples taken after fasting for at least 8 hours.   No results found.  Review of Systems  Blood pressure (!) 153/83, pulse 84, temperature 98.3 F (36.8 C), temperature source Oral, resp. rate 16, height 5\' 2"  (1.575 m), weight 90.8 kg, last menstrual period 10/16/2011, SpO2 98 %. Physical Exam Constitutional:      General: She is not in acute distress.    Appearance: Normal appearance.  HENT:     Head: Normocephalic and atraumatic.  Eyes:     General: No scleral icterus. Pulmonary:     Effort: Pulmonary effort is normal. No respiratory distress.  Abdominal:     General: There is no distension.     Palpations: Abdomen is soft.     Tenderness: There is no abdominal tenderness.  Musculoskeletal:        General: Normal range of motion.     Cervical back: Normal range of motion.  Skin:    General: Skin is warm and dry.     Coloration: Skin is not jaundiced.  Neurological:     General: No focal deficit present.     Mental Status: She is alert and oriented to person, place, and time. Mental status is at  baseline.  Psychiatric:        Mood and Affect: Mood normal.        Behavior: Behavior normal.        Thought Content: Thought content normal.      Assessment/Plan 55 yo female with symptomatic cholelithiasis. Proceed to OR for lap cholecystectomy. Informed consent obtained. Plan for discharge home  from PACU.  Dwan Bolt, MD 09/22/2020, 11:13 AM

## 2020-09-23 ENCOUNTER — Encounter (HOSPITAL_COMMUNITY): Payer: Self-pay | Admitting: Surgery

## 2020-09-23 LAB — SURGICAL PATHOLOGY

## 2020-09-23 NOTE — Anesthesia Postprocedure Evaluation (Signed)
Anesthesia Post Note  Patient: Isabella Galloway  Procedure(s) Performed: LAPAROSCOPIC CHOLECYSTECTOMY (N/A Abdomen)     Patient location during evaluation: PACU Anesthesia Type: General Level of consciousness: sedated and patient cooperative Pain management: pain level controlled Vital Signs Assessment: post-procedure vital signs reviewed and stable Respiratory status: spontaneous breathing Cardiovascular status: stable Anesthetic complications: no   No complications documented.  Last Vitals:  Vitals:   09/22/20 1600 09/22/20 1615  BP: 140/89 (!) 158/80  Pulse: 79 78  Resp: 16 18  Temp:  36.8 C  SpO2: 98% 98%    Last Pain:  Vitals:   09/22/20 1615  TempSrc:   PainSc: [place]

## 2021-05-09 ENCOUNTER — Emergency Department (HOSPITAL_COMMUNITY)
Admission: EM | Admit: 2021-05-09 | Discharge: 2021-05-10 | Disposition: A | Discharge status: Home / Self Care | Payer: Medicare Other | Attending: Emergency Medicine | Admitting: Emergency Medicine

## 2021-05-09 ENCOUNTER — Encounter (HOSPITAL_COMMUNITY): Payer: Self-pay | Admitting: Emergency Medicine

## 2021-05-09 ENCOUNTER — Emergency Department (HOSPITAL_COMMUNITY): Payer: Medicare Other

## 2021-05-09 DIAGNOSIS — R109 Unspecified abdominal pain: Secondary | ICD-10-CM | POA: Diagnosis present

## 2021-05-09 DIAGNOSIS — E119 Type 2 diabetes mellitus without complications: Secondary | ICD-10-CM | POA: Diagnosis not present

## 2021-05-09 DIAGNOSIS — I10 Essential (primary) hypertension: Secondary | ICD-10-CM | POA: Diagnosis not present

## 2021-05-09 DIAGNOSIS — D72829 Elevated white blood cell count, unspecified: Secondary | ICD-10-CM | POA: Insufficient documentation

## 2021-05-09 DIAGNOSIS — K219 Gastro-esophageal reflux disease without esophagitis: Secondary | ICD-10-CM | POA: Insufficient documentation

## 2021-05-09 DIAGNOSIS — N200 Calculus of kidney: Secondary | ICD-10-CM | POA: Insufficient documentation

## 2021-05-09 LAB — URINALYSIS, ROUTINE W REFLEX MICROSCOPIC
Bilirubin Urine: NEGATIVE
Glucose, UA: 150 mg/dL — AB
Ketones, ur: 80 mg/dL — AB
Nitrite: NEGATIVE
Protein, ur: 100 mg/dL — AB
RBC / HPF: >50 RBC/hpf — ABNORMAL HIGH (ref 0–5)
Specific Gravity, Urine: 1.027 (ref 1.005–1.030)
pH: 6 (ref 5.0–8.0)

## 2021-05-09 LAB — COMPREHENSIVE METABOLIC PANEL
ALT: 28 U/L (ref 0–44)
AST: 24 U/L (ref 15–41)
Albumin: 4.5 g/dL (ref 3.5–5.0)
Alkaline Phosphatase: 60 U/L (ref 38–126)
Anion gap: 11 (ref 5–15)
BUN: 22 mg/dL — ABNORMAL HIGH (ref 6–20)
CO2: 20 mmol/L — ABNORMAL LOW (ref 22–32)
Calcium: 9.5 mg/dL (ref 8.9–10.3)
Chloride: 105 mmol/L (ref 98–111)
Creatinine, Ser: 0.96 mg/dL (ref 0.44–1.00)
GFR, Estimated: >60 mL/min (ref >60)
Glucose, Bld: 241 mg/dL — ABNORMAL HIGH (ref 70–99)
Potassium: 4.1 mmol/L (ref 3.5–5.1)
Sodium: 136 mmol/L (ref 135–145)
Total Bilirubin: 0.8 mg/dL (ref 0.3–1.2)
Total Protein: 8.1 g/dL (ref 6.5–8.1)

## 2021-05-09 LAB — CBC
HCT: 46.8 % — ABNORMAL HIGH (ref 36.0–46.0)
Hemoglobin: 15.6 g/dL — ABNORMAL HIGH (ref 12.0–15.0)
MCH: 30.4 pg (ref 26.0–34.0)
MCHC: 33.3 g/dL (ref 30.0–36.0)
MCV: 91.2 fL (ref 80.0–100.0)
Platelets: 246 10*3/uL (ref 150–400)
RBC: 5.13 MIL/uL — ABNORMAL HIGH (ref 3.87–5.11)
RDW: 13.2 % (ref 11.5–15.5)
WBC: 13.7 10*3/uL — ABNORMAL HIGH (ref 4.0–10.5)
nRBC: 0 % (ref 0.0–0.2)

## 2021-05-09 MED ORDER — METOCLOPRAMIDE HCL 5 MG/ML IJ SOLN
10.0000 mg | Freq: Once | INTRAMUSCULAR | Status: AC
  Administered 2021-05-09: 22:00:00 10 mg via INTRAVENOUS
  Filled 2021-05-09: qty 2

## 2021-05-09 MED ORDER — OXYCODONE HCL 5 MG PO TABS: 5.0000 mg | ORAL_TABLET | Freq: Four times a day (QID) | ORAL | 0 refills | Status: DC | PRN

## 2021-05-09 MED ORDER — LACTATED RINGERS IV BOLUS
1000.0000 mL | Freq: Once | INTRAVENOUS | Status: AC
  Administered 2021-05-09: 20:00:00 1000 mL via INTRAVENOUS

## 2021-05-09 MED ORDER — ONDANSETRON HCL 4 MG/2ML IJ SOLN
4.0000 mg | Freq: Once | INTRAMUSCULAR | Status: AC
  Administered 2021-05-09: 20:00:00 4 mg via INTRAVENOUS
  Filled 2021-05-09: qty 2

## 2021-05-09 MED ORDER — TAMSULOSIN HCL 0.4 MG PO CAPS: 0.4000 mg | ORAL_CAPSULE | Freq: Every day | ORAL | 0 refills | Status: AC

## 2021-05-09 MED ORDER — HYDROMORPHONE HCL 1 MG/ML IJ SOLN
1.0000 mg | Freq: Once | INTRAMUSCULAR | Status: AC
  Administered 2021-05-09: 20:00:00 1 mg via INTRAVENOUS
  Filled 2021-05-09: qty 1

## 2021-05-09 MED ORDER — TAMSULOSIN HCL 0.4 MG PO CAPS
0.4000 mg | ORAL_CAPSULE | Freq: Once | ORAL | Status: AC
  Administered 2021-05-09: 22:00:00 0.4 mg via ORAL
  Filled 2021-05-09: qty 1

## 2021-05-09 MED ORDER — ONDANSETRON 4 MG PO TBDP: 4.0000 mg | ORAL_TABLET | Freq: Three times a day (TID) | ORAL | 0 refills | Status: AC | PRN

## 2021-05-09 MED ORDER — HYDROMORPHONE HCL 1 MG/ML IJ SOLN
0.5000 mg | Freq: Once | INTRAMUSCULAR | Status: AC
  Administered 2021-05-09: 22:00:00 0.5 mg via INTRAVENOUS
  Filled 2021-05-09: qty 1

## 2021-05-09 MED ORDER — IOHEXOL 350 MG/ML SOLN
80.0000 mL | Freq: Once | INTRAVENOUS | Status: AC | PRN
  Administered 2021-05-09: 21:00:00 80 mL via INTRAVENOUS

## 2021-05-09 MED ORDER — LACTATED RINGERS IV BOLUS
1000.0000 mL | Freq: Once | INTRAVENOUS | Status: AC
  Administered 2021-05-09: 22:00:00 1000 mL via INTRAVENOUS

## 2021-05-09 NOTE — ED Provider Notes (Signed)
[facility] DEPT Provider Note   CSN: [id number] Arrival date & time: 05/09/21  1837     History Chief Complaint  Patient presents with   Flank Pain   Emesis    Isabella Galloway is a 55 y.o. female.   Flank Pain Pertinent negatives include no chest pain, no abdominal pain and no shortness of breath.  Emesis Associated symptoms: no abdominal pain, no arthralgias, no chills, no cough, no diarrhea, no fever, no myalgias and no sore throat   Patient presents for right flank pain, nausea, and vomiting.  Onset of symptoms was this morning.  She has not taken anything for relief of symptoms.  Currently, right flank pain is 10/10 in severity.  She has had persistent nausea, vomiting, and p.o. intolerance.  She had a kidney stone earlier this year.  During this previous episode, she did have to have ureteral stent placed.  Stent was removed in August.  She had a cholecystectomy in May of this year.  She states that her current pain is reminiscent of her prior kidney stone.   She denies any dysuria, hematuria, fevers, chills, or diarrhea.    Past Medical History:  Diagnosis Date   Anxiety    Arthritis    back, neck    Depression    Diabetes mellitus without complication ([place])    type 2    Dyspnea    with exertion    Family history of breast cancer    Genetic testing 04/11/2017   Multi-Cancer panel (83 genes) @ [facility] - No pathogenic mutations detected   GERD (gastroesophageal reflux disease)    History of kidney stones    passed stone - no surgery   Hyperlipidemia    Peripheral neuropathy    bi lat legs and feet, fingers, carpel tunnel   Retinopathy    gets shot every 6-8 weeks both eyes   Treadmill stress test negative for angina pectoris    NORMAL PER PATIENT test done for high colesterol    Patient Active Problem List   Diagnosis Date Noted   Sepsis without acute organ dysfunction ([place]) 60/63/0160   Ureteral colic 10/93/2355   Parotid  swelling 11/16/2019   Ureteral stone 11/14/2019   Genetic testing 04/11/2017   Family history of breast cancer    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   Coronary artery calcification seen on CAT scan 10/05/2016   Diabetes mellitus type 2 in obese ([place]) 10/05/2016   Dyslipidemia 10/05/2016    Past Surgical History:  Procedure Laterality Date   BREAST SURGERY Left    benign with metal clip   CHOLECYSTECTOMY N/A 09/22/2020   Procedure: LAPAROSCOPIC CHOLECYSTECTOMY;  Surgeon: Dwan Bolt, MD;  Location: [facility];  Service: General;  Laterality: N/A;   COLONOSCOPY     polyp   CYSTOSCOPY WITH STENT PLACEMENT Left 11/14/2019   Procedure: CYSTOSCOPY AND LEFT URETERAL WITH STENT PLACEMENT retrograde pylegram;  Surgeon: Raynelle Bring, MD;  Location: [facility];  Service: Urology;  Laterality: Left;   CYSTOSCOPY/URETEROSCOPY/HOLMIUM LASER/STENT PLACEMENT Left 12/14/2019   Procedure: CYSTOSCOPY/RETROGRADE/URETEROSCOPY/STENT REMOVAL;  Surgeon: Raynelle Bring, MD;  Location: [facility];  Service: Urology;  Laterality: Left;   INCISION AND DRAINAGE FOOT Right 2019   LAPAROSCOPIC BILATERAL SALPINGO OOPHERECTOMY Bilateral 03/06/2017   Procedure: LAPAROSCOPIC Left Salpingectomy & OOPHORECTOMY, Right Oophorectomy;  Surgeon: Janyth Pupa, DO;  Location: [facility];  Service: Gynecology;  Laterality: Bilateral;   PILONIDAL CYST DRAINAGE  1980   tailbone fx & subsequent cyst with excision  UPPER GI ENDOSCOPY     WISDOM TOOTH EXTRACTION       OB History   No obstetric history on file.     Family History  Problem Relation Age of Onset   Heart failure Mother    COPD Mother    Cancer Mother        lung cancer   Other Mother        pituitary tumor   Breast cancer Maternal Grandmother 15       deceased 33   Heart attack Maternal Grandfather    Cancer Other        mat granmother's sisters: one with leukemia and 2 with kidney ca at older ages    Social History   Tobacco Use   Smoking status: Never    Smokeless tobacco: Never  Vaping Use   Vaping Use: Never used  Substance Use Topics   Alcohol use: No   Drug use: No    Home Medications Prior to Admission medications   Medication Sig Start Date End Date Taking? Authorizing Provider  bifidobacterium infantis (ALIGN) capsule Take 1 capsule by mouth daily.   Yes [provider]  cyclobenzaprine (FLEXERIL) 10 MG tablet Take 10 mg by mouth 3 (three) times daily as needed for muscle spasms.   Yes [provider]  docusate sodium (COLACE) 100 MG capsule Take 1 capsule (100 mg total) by mouth 2 (two) times daily. Patient taking differently: Take 100 mg by mouth daily as needed for mild constipation. 03/06/17  Yes Janyth Pupa, DO  escitalopram (LEXAPRO) 20 MG tablet Take 20 mg by mouth daily. 03/27/21  Yes [provider]  furosemide (LASIX) 20 MG tablet Take 20 mg by mouth daily.   Yes [provider]  gabapentin (NEURONTIN) 300 MG capsule Take 300 mg by mouth See admin instructions. Takes 1 capsule in the morning and at night and 1 capsule in between if needed.   Yes [provider]  insulin degludec (TRESIBA FLEXTOUCH) 100 UNIT/ML FlexTouch Pen Inject 16 Units into the skin daily.   Yes [provider]  insulin lispro (HUMALOG) 100 UNIT/ML KwikPen Inject 10-20 Units into the skin in the morning and at bedtime. Per sliding scale  Lunch and supper 09/05/20  Yes [provider]  meloxicam (MOBIC) 15 MG tablet Take 15 mg by mouth daily as needed for pain.    Yes [provider]  ondansetron (ZOFRAN-ODT) 4 MG disintegrating tablet Take 1 tablet (4 mg total) by mouth every 8 (eight) hours as needed for nausea or vomiting. 05/09/21  Yes Godfrey Pick, MD  oxyCODONE (ROXICODONE) 5 MG immediate release tablet Take 1 tablet (5 mg total) by mouth every 6 (six) hours as needed for severe pain. 05/09/21  Yes Godfrey Pick, MD  OZEMPIC, 0.25 OR 0.5 MG/DOSE, 2 MG/1.5ML SOPN Inject 1 mg into  the skin once a week. 01/02/21  Yes [provider]  rosuvastatin (CRESTOR) 10 MG tablet Take 1 tablet (10 mg total) by mouth daily. NEED OV. Patient taking differently: Take 10 mg by mouth daily. 01/21/18  Yes Croitoru, Mihai, MD  tamsulosin (FLOMAX) 0.4 MG CAPS capsule Take 1 capsule (0.4 mg total) by mouth daily for 3 days. 05/09/21 05/12/21 Yes Godfrey Pick, MD  traZODone (DESYREL) 50 MG tablet Take 50 mg by mouth at bedtime as needed for sleep.    Yes [provider]  Vitamin D, Ergocalciferol, (DRISDOL) 1.25 MG (50000 UNIT) CAPS capsule Take 50,000 Units by mouth every  7 (seven) days. 11/04/19  Yes [provider]  HYDROcodone-acetaminophen (NORCO/VICODIN) 5-325 MG tablet Take 1 tablet by mouth every 6 (six) hours as needed for severe pain. Patient not taking: Reported on 05/09/2021 09/22/20   Dwan Bolt, MD    Allergies    Patient has no known allergies.  Review of Systems   Review of Systems  Constitutional:  Negative for chills, fatigue and fever.  HENT:  Negative for ear pain and sore throat.   Eyes:  Negative for pain and visual disturbance.  Respiratory:  Negative for cough, chest tightness and shortness of breath.   Cardiovascular:  Negative for chest pain and palpitations.  Gastrointestinal:  Positive for nausea and vomiting. Negative for abdominal distention, abdominal pain, constipation and diarrhea.  Genitourinary:  Positive for flank pain. Negative for dysuria, hematuria, pelvic pain, vaginal bleeding and vaginal discharge.  Musculoskeletal:  Negative for arthralgias, back pain, myalgias and neck pain.  Skin:  Negative for color change and rash.  Neurological:  Negative for dizziness, seizures, syncope, weakness, light-headedness and numbness.  Hematological:  Does not bruise/bleed easily.  Psychiatric/Behavioral:  Negative for confusion and decreased concentration.   All other systems reviewed and are negative.  Physical Exam Updated Vital  Signs BP (!) 153/89    Pulse 100    Temp 98 F (36.7 C) (Oral)    Resp 18    LMP 10/16/2011 (Exact Date)    SpO2 98%   Physical Exam Vitals and nursing note reviewed.  Constitutional:      General: She is not in acute distress.    Appearance: Normal appearance. She is well-developed. She is ill-appearing. She is not toxic-appearing or diaphoretic.     Comments: Actively dry heaving  HENT:     Head: Normocephalic and atraumatic.     Right Ear: External ear normal.     Left Ear: External ear normal.     Nose: Nose normal.     Mouth/Throat:     Mouth: Mucous membranes are moist.     Pharynx: Oropharynx is clear.  Eyes:     Extraocular Movements: Extraocular movements intact.     Conjunctiva/sclera: Conjunctivae normal.  Cardiovascular:     Rate and Rhythm: Normal rate and regular rhythm.     Heart sounds: No murmur heard. Pulmonary:     Effort: Pulmonary effort is normal. No respiratory distress.     Breath sounds: Normal breath sounds. No wheezing or rales.  Abdominal:     Palpations: Abdomen is soft.     Tenderness: There is abdominal tenderness (Mild). There is no right CVA tenderness, left CVA tenderness, guarding or rebound.  Musculoskeletal:        General: No swelling.     Cervical back: Normal range of motion and neck supple. No rigidity.     Right lower leg: No edema.     Left lower leg: No edema.  Skin:    General: Skin is warm and dry.     Capillary Refill: Capillary refill takes less than 2 seconds.     Coloration: Skin is not jaundiced or pale.  Neurological:     General: No focal deficit present.     Mental Status: She is alert and oriented to person, place, and time.     Cranial Nerves: No cranial nerve deficit.     Sensory: No sensory deficit.     Motor: No weakness.     Coordination: Coordination normal.  Psychiatric:  Mood and Affect: Mood normal.        Behavior: Behavior normal.        Thought Content: Thought content normal.        Judgment:  Judgment normal.    ED Results / Procedures / Treatments   Labs (all labs ordered are listed, but only abnormal results are displayed) Labs Reviewed  URINALYSIS, ROUTINE W REFLEX MICROSCOPIC - Abnormal; Notable for the following components:      Result Value   APPearance CLOUDY (*)    Glucose, UA 150 (*)    Hgb urine dipstick LARGE (*)    Ketones, ur 80 (*)    Protein, ur 100 (*)    Leukocytes,Ua MODERATE (*)    RBC / HPF >50 (*)    Bacteria, UA RARE (*)    Non Squamous Epithelial 0-5 (*)    All other components within normal limits  CBC - Abnormal; Notable for the following components:   WBC 13.7 (*)    RBC 5.13 (*)    Hemoglobin 15.6 (*)    HCT 46.8 (*)    All other components within normal limits  COMPREHENSIVE METABOLIC PANEL - Abnormal; Notable for the following components:   CO2 20 (*)    Glucose, Bld 241 (*)    BUN 22 (*)    All other components within normal limits  URINE CULTURE    EKG None  Radiology CT ABDOMEN PELVIS W CONTRAST  Result Date: 05/09/2021 CLINICAL DATA:  Flank pain.  Concern for kidney stone. EXAM: CT ABDOMEN AND PELVIS WITH CONTRAST TECHNIQUE: Multidetector CT imaging of the abdomen and pelvis was performed using the standard protocol following bolus administration of intravenous contrast. CONTRAST:  35mL OMNIPAQUE IOHEXOL 350 MG/ML SOLN COMPARISON:  CT abdomen pelvis dated 11/13/2019. FINDINGS: Lower chest: The visualized lung bases are clear. No intra-abdominal free air or free fluid. Hepatobiliary: Probable fatty liver. No intrahepatic biliary dilatation. Cholecystectomy. No retained calcified stone noted in the central CBD. Pancreas: Unremarkable. No pancreatic ductal dilatation or surrounding inflammatory changes. Spleen: Normal in size without focal abnormality. Adrenals/Urinary Tract: Indeterminate 1 cm right adrenal nodule. The left adrenal gland is unremarkable. There is a 4 mm stone in the distal left ureter with mild right hydronephrosis.  Additional 3 mm nonobstructing stone noted in the lower pole of the right kidney. The left kidney, left ureter, and urinary bladder appear unremarkable. Stomach/Bowel: There is no bowel obstruction or active inflammation. The appendix is normal. Vascular/Lymphatic: Mild aortoiliac atherosclerotic disease. The IVC is unremarkable. No portal venous gas. No adenopathy. Reproductive: The uterus is anteverted.  No adnexal masses. Other: None Musculoskeletal: No acute or significant osseous findings. IMPRESSION: 1. A 4 mm distal left ureteral stone with mild right hydronephrosis. Additional 3 mm nonobstructing stone in the lower pole of the right kidney. 2. No bowel obstruction. Normal appendix. 3. Indeterminate 1 cm right adrenal nodule. 4. Aortic Atherosclerosis (ICD10-I70.0). Electronically Signed   By: Anner Crete M.D.   On: 05/09/2021 21:14    Procedures Procedures   Medications Ordered in ED Medications  ondansetron [facility]) injection 4 mg (4 mg Intravenous Given 05/09/21 2012)  lactated ringers bolus 1,000 mL (0 mLs Intravenous Stopped 05/09/21 2138)  HYDROmorphone (DILAUDID) injection 1 mg (1 mg Intravenous Given 05/09/21 2014)  iohexol (OMNIPAQUE) 350 MG/ML injection 80 mL (80 mLs Intravenous Contrast Given 05/09/21 2053)  metoCLOPramide (REGLAN) injection 10 mg (10 mg Intravenous Given 05/09/21 2133)  HYDROmorphone (DILAUDID) injection 0.5 mg (0.5 mg Intravenous Given 05/09/21  2136)  tamsulosin (FLOMAX) capsule 0.4 mg (0.4 mg Oral Given 05/09/21 2209)  lactated ringers bolus 1,000 mL (0 mLs Intravenous Stopped 05/09/21 2346)    ED Course  I have reviewed the triage vital signs and the nursing notes.  Pertinent labs & imaging results that were available during my care of the patient were reviewed by me and considered in my medical decision making (see chart for details).    MDM Rules/Calculators/A&P                         Patient is a 55 year old female with history of kidney  stones, presenting for cute onset of right flank pain, nausea, vomiting since this morning.  Prior to this morning, she was in her normal state of health.  On arrival in the ED, patient's vital signs are notable for moderate hypertension.  This is likely secondary to pain, which she describes as 10/10 in severity.  On initial assessment, patient is actively dry heaving.  Zofran and Dilaudid were ordered for symptomatic relief.  Patient was given IV fluids for rehydration.  Lab work and CT scan of abdomen pelvis was ordered.  Patient required additional medications.  Reglan and additional Dilaudid were given.  CT scan showed a 4 mm stone in distal right ureter, consistent with her area of pain.  Flomax and additional IV fluids were ordered.  Urinalysis showed microscopic hematuria without any clear evidence of acute infection.  Lab work showed leukocytosis.  I suspect this is from stress demargination rather than infection.  While in the ED, patient had complete resolution of her symptoms.  She was able to tolerate p.o. intake.  Given her improvement, patient is appropriate for discharge home.  She was prescribed as needed Roxicodone and Zofran.  She was advised to continue daily Flomax.  She was also advised to call [facility] in the morning to set up a close follow-up appointment.  Return precautions were given for severe uncontrolled pain, recurrence of vomiting and p.o. intolerance, or onset of fevers or chills.  Patient was discharged in stable condition.  Final Clinical Impression(s) / ED Diagnoses Final diagnoses:  Kidney stone    Rx / DC Orders ED Discharge Orders          Ordered    oxyCODONE (ROXICODONE) 5 MG immediate release tablet  Every 6 hours PRN        05/09/21 2327    ondansetron (ZOFRAN-ODT) 4 MG disintegrating tablet  Every 8 hours PRN        05/09/21 2327    tamsulosin (FLOMAX) 0.4 MG CAPS capsule  Daily        05/09/21 2327             Godfrey Pick, MD 05/10/21  [phone]

## 2021-05-09 NOTE — ED Triage Notes (Signed)
Patient here from home reporting right side abd pain radiating to flank. N/v.

## 2021-05-09 NOTE — ED Notes (Signed)
Pt is dry heaving atm sitting up on the side of the bed.

## 2021-05-10 LAB — URINE CULTURE

## 2023-10-27 IMAGING — CT CT ABD-PELV W/ CM
2 of 5 series · 16 of 46 positions shown, 18 images · IV contrast (omnipaque)
Comparison: CT abdomen pelvis dated 11/13/2019.

CLINICAL DATA: Flank pain.  Concern for kidney stone.

EXAM:
CT ABDOMEN AND PELVIS WITH CONTRAST
TECHNIQUE: Multidetector CT imaging of the abdomen and pelvis was performed
using the standard protocol following bolus administration of
intravenous contrast.
CONTRAST:  80mL OMNIPAQUE IOHEXOL 350 MG/ML SOLN

[Series 2: axial st · axial · 0.81mm/px · z∈[-1018,-564]mm · 13 of 106 slices shown, 15 images]
[im 8/106  soft-tissue]
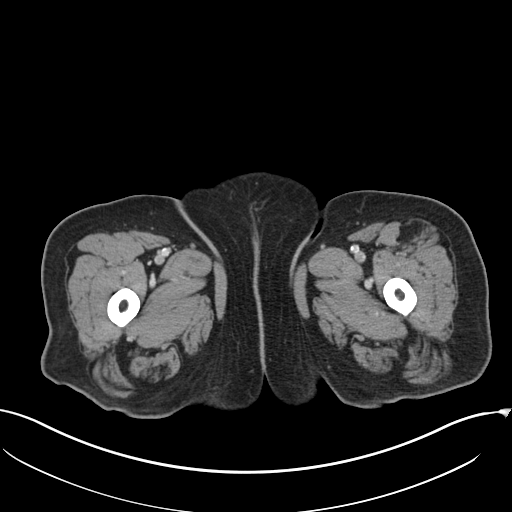
[im 8/106  bone]
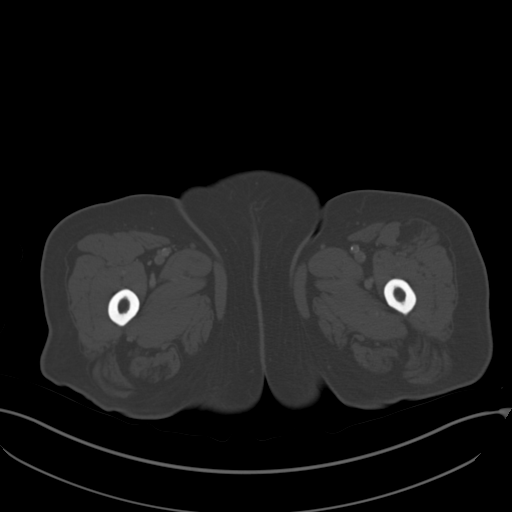
[im 15/106  soft-tissue]
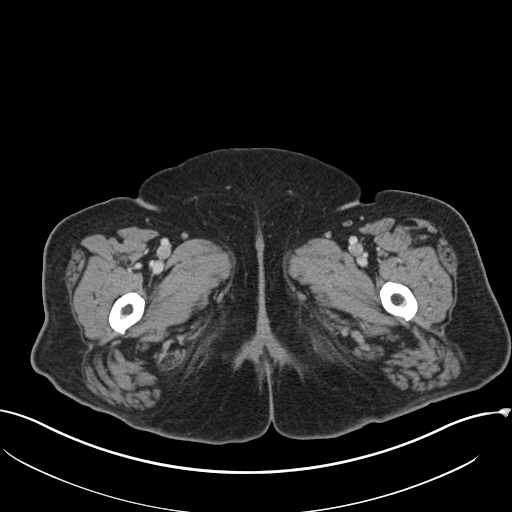
[im 22/106  soft-tissue]
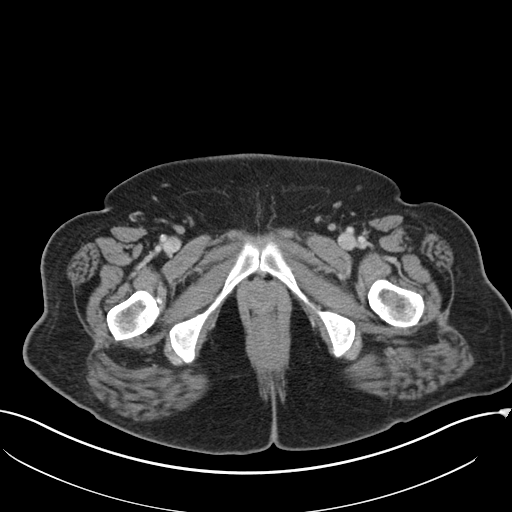
[im 29/106  soft-tissue]
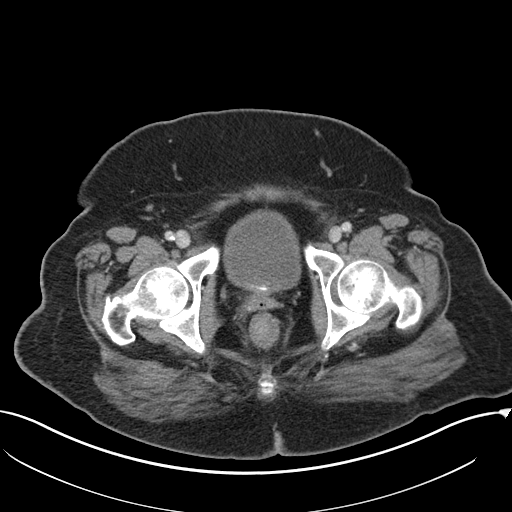
[im 36/106  soft-tissue]
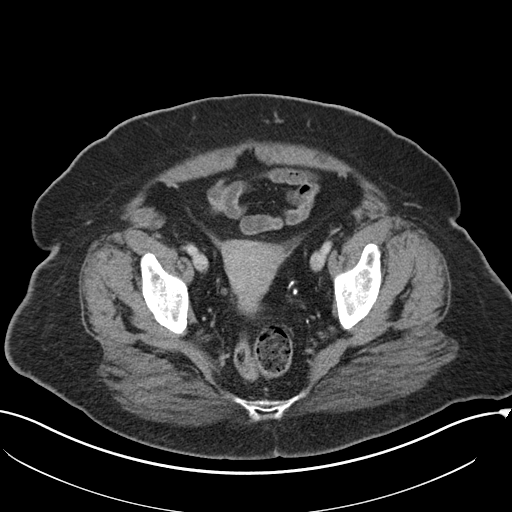
[im 43/106  soft-tissue]
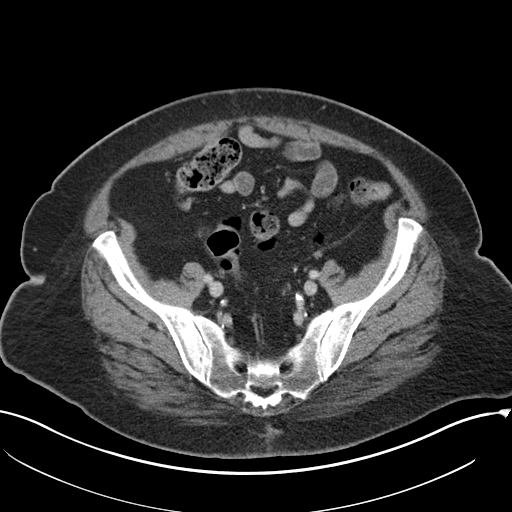
[im 57/106  soft-tissue]
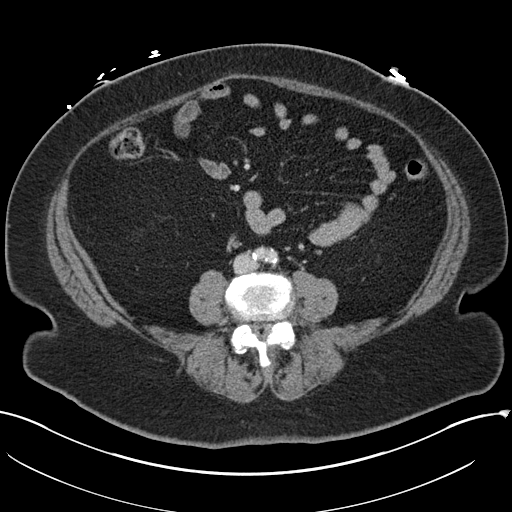
[im 64/106  soft-tissue]
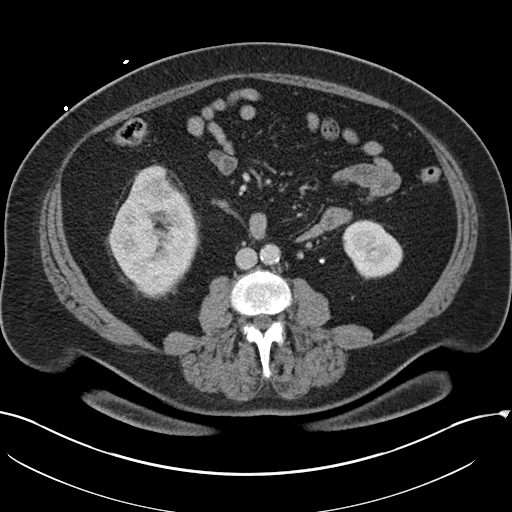
[im 71/106  soft-tissue]
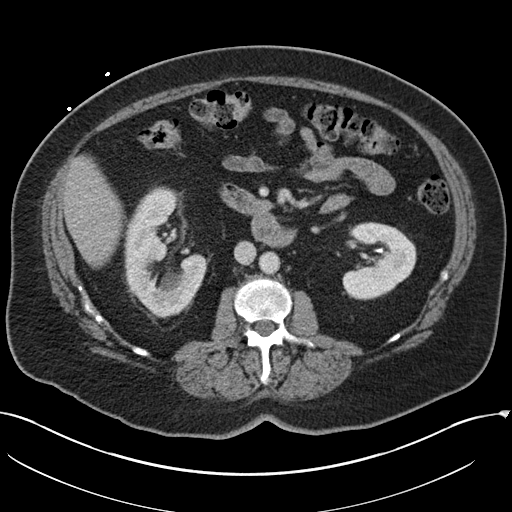
[im 71/106  bone]
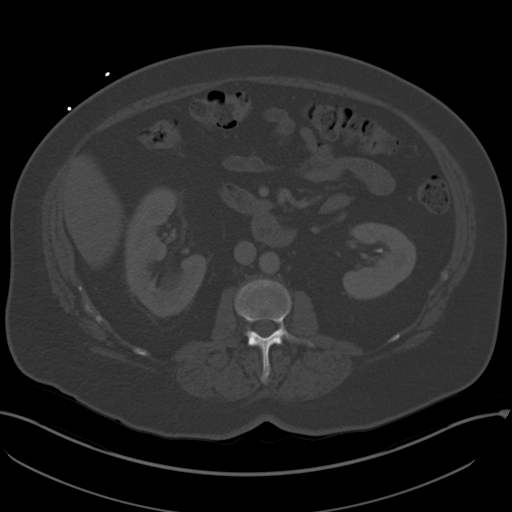
[im 78/106  soft-tissue]
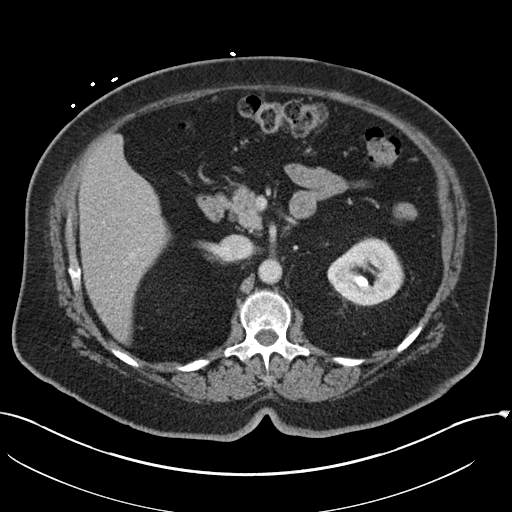
[im 85/106  soft-tissue]
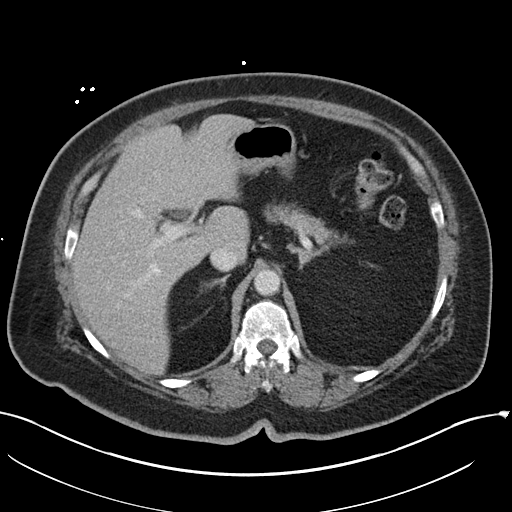
[im 92/106  soft-tissue]
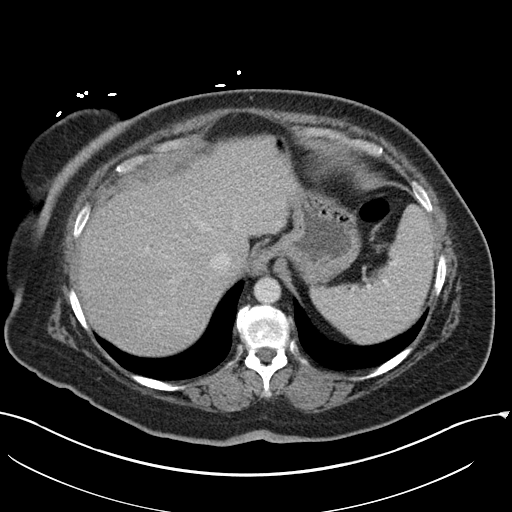
[im 99/106  soft-tissue]
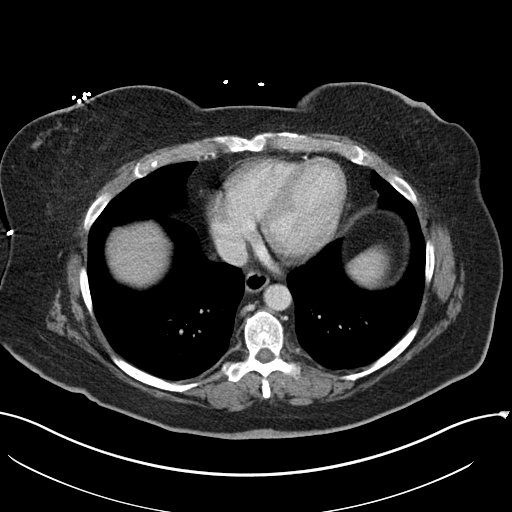

[Series 4: coronal st · coronal · 1.00mm/px · 3 of 162 slices shown]
[im 54/162  soft-tissue]
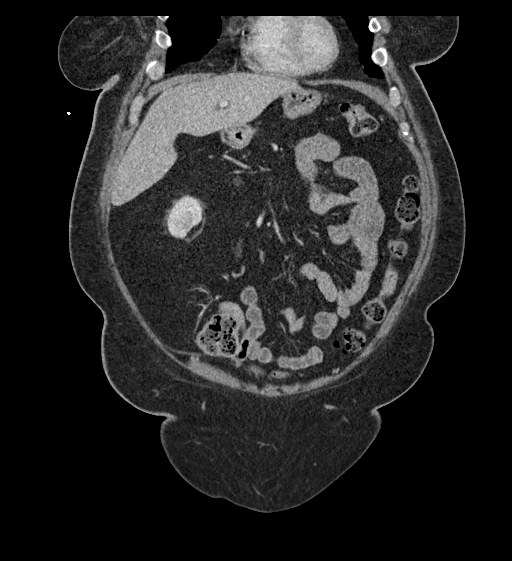
[im 72/162  soft-tissue]
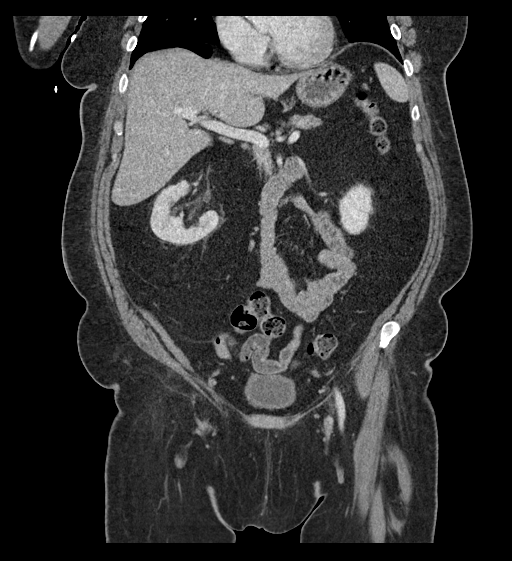
[im 90/162  soft-tissue]
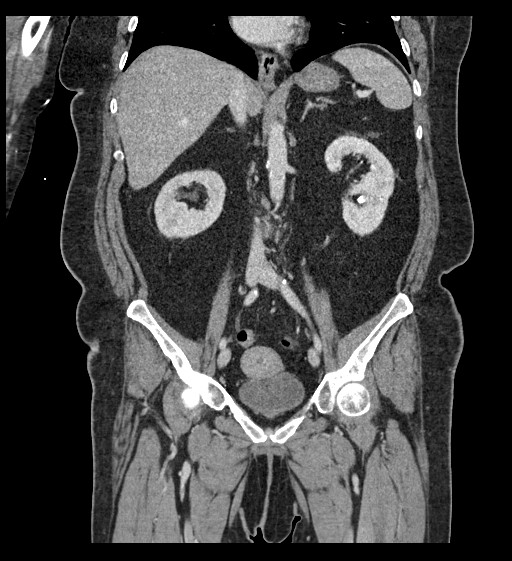

[16 of 46 positions shown; findings below may reference images not displayed]

FINDINGS: Lower chest: The visualized lung bases are clear.

No intra-abdominal free air or free fluid.

Hepatobiliary: Probable fatty liver. No intrahepatic biliary
dilatation. Cholecystectomy. No retained calcified stone noted in
the central CBD.

Pancreas: Unremarkable. No pancreatic ductal dilatation or
surrounding inflammatory changes.

Spleen: Normal in size without focal abnormality.

Adrenals/Urinary Tract: Indeterminate 1 cm right adrenal nodule. The
left adrenal gland is unremarkable. There is a 4 mm stone in the
distal left ureter with mild right hydronephrosis. Additional 3 mm
nonobstructing stone noted in the lower pole of the right kidney.
The left kidney, left ureter, and urinary bladder appear
unremarkable.

Stomach/Bowel: There is no bowel obstruction or active inflammation.
The appendix is normal.

Vascular/Lymphatic: Mild aortoiliac atherosclerotic disease. The IVC
is unremarkable. No portal venous gas. No adenopathy.

Reproductive: The uterus is anteverted.  No adnexal masses.

Other: None

Musculoskeletal: No acute or significant osseous findings.
IMPRESSION: 1. A 4 mm distal left ureteral stone with mild right hydronephrosis.
Additional 3 mm nonobstructing stone in the lower pole of the right
kidney.
2. No bowel obstruction. Normal appendix.
3. Indeterminate 1 cm right adrenal nodule.
4. Aortic Atherosclerosis (FRY38-I58.8).

## 2024-03-18 ENCOUNTER — Encounter: Payer: Self-pay | Admitting: Endocrinology

## 2024-03-18 ENCOUNTER — Other Ambulatory Visit

## 2024-03-18 ENCOUNTER — Ambulatory Visit: Payer: Self-pay | Admitting: Endocrinology

## 2024-03-18 ENCOUNTER — Ambulatory Visit: Admitting: Endocrinology

## 2024-03-18 VITALS — BP 144/82 | HR 81 | Resp 20 | Ht 62.0 in | Wt 193.4 lb

## 2024-03-18 DIAGNOSIS — Z794 Long term (current) use of insulin: Secondary | ICD-10-CM | POA: Diagnosis not present

## 2024-03-18 DIAGNOSIS — E1165 Type 2 diabetes mellitus with hyperglycemia: Secondary | ICD-10-CM

## 2024-03-18 LAB — POCT GLYCOSYLATED HEMOGLOBIN (HGB A1C): Hemoglobin A1C: 7.1 % — AB (ref 4.0–5.6)

## 2024-03-18 NOTE — Progress Notes (Signed)
 Outpatient Endocrinology Note Isabella Edmunds, MD   Patient's Name: Isabella Galloway    DOB: 1965/06/05    MRN: [id number]                                                    REASON OF VISIT: New consult for type 2 diabetes mellitus  REFERRING PROVIDER: Gretta Coolidge SQUIBB, NP  PCP: Gretta Coolidge SQUIBB, NP  HISTORY OF PRESENT ILLNESS:   Isabella Galloway is a 58 y.o. old female with past medical history listed below, is here for new consult for type 2 diabetes mellitus.   Pertinent Diabetes History: Patient is referred to endocrinology for evaluation and the management of type 2 diabetes mellitus, initial consult/visit on March 18, 2024.  Patient was following with primary care provider /family medicine at [facility] for diabetes medicine  Patient was diagnosed with type 2 diabetes mellitus around 2018.  Patient reports she was prediabetic prior to diet and was not taking care much prior to 2018.  History of DKA or diabetes related hospitalizations: none  No personal history of pancreatitis and / or family history of medullary thyroid carcinoma or MEN 2B syndrome.   Chronic Diabetes Complications : Retinopathy: yes.  Proliferative diabetic retinopathy bilateral last ophthalmology exam was done on 10/2023, following with ophthalmology regularly.  Nephropathy: no Peripheral neuropathy: yes, duloxetine.  Gabapentin  ineffective /sedating and Lyrica sedating.  History of diabetic foot ulcer. Coronary artery disease: no Stroke: no  Relevant comorbidities and cardiovascular risk factors: Obesity: yes Body mass index is 35.37 kg/m.  Hypertension: no  Hyperlipidemia : Yes, on statin   Current / Home Diabetic regimen includes:  Ozempic 2 mg weekly. Tresiba 26 units daily in the morning. Fiasp  6 units with meals as needed, not fully compliant.   She has been getting Ozempic, Tresiba and Fiasp  by patient assistance program.  Prior diabetic medications: Metformin  stopped due to GI intolerance.   Mounjaro and ozempic used interchageabliy.  Ozempic vis patient assistance program.   Glycemic data:    CONTINUOUS GLUCOSE MONITORING SYSTEM (CGMS) INTERPRETATION: At today's visit, we reviewed CGM downloads. The full report is scanned in the media. Reviewing the CGM trends, blood glucose are as follows:  FreeStyle Libre 3+ CGM-  Sensor Download (Sensor download was reviewed and summarized below.) Dates: October 23 to March 18, 2024  Glucose Management Indicator: 7.4%    Interpretation: - Mostly acceptable blood sugar with occasional postprandial hyperglycemia with blood sugar 250 range and mostly related to supper.  Mild hyperglycemia in the early morning fasting.  No hypoglycemia.  Hypoglycemia: Patient has no hypoglycemic episodes. Patient has hypoglycemia awareness.  Factors modifying glucose control: 1.  Diabetic diet assessment: 3 meals a day.  2.  Staying active or exercising:   3.  Medication compliance: compliant most of the time.  Interval history  Patient presented to establish care for type 2 diabetes mellitus.  Initial visit today.  Patient has complaints of decreased sensation of bilateral foot.  She is concerned about weight gain /not able to lose enough weight.  Lab results completed at [facility] in August, has normal electrolytes and renal function.  Acceptable lipid panel.  REVIEW OF SYSTEMS As per history of present illness.   PAST MEDICAL HISTORY: Past Medical History:  Diagnosis Date   Anxiety    Arthritis  back, neck    Depression    Diabetes mellitus without complication (HCC)    type 2    Dyspnea    with exertion    Family history of breast cancer    Genetic testing 04/11/2017   Multi-Cancer panel (83 genes) @ [facility] - No pathogenic mutations detected   GERD (gastroesophageal reflux disease)    History of kidney stones    passed stone - no surgery   Hyperlipidemia    Peripheral neuropathy    bi lat legs and feet, fingers, carpel tunnel    Retinopathy    gets shot every 6-8 weeks both eyes   Treadmill stress test negative for angina pectoris    NORMAL PER PATIENT test done for high colesterol    PAST SURGICAL HISTORY: Past Surgical History:  Procedure Laterality Date   BREAST SURGERY Left    benign with metal clip   CHOLECYSTECTOMY N/A 09/22/2020   Procedure: LAPAROSCOPIC CHOLECYSTECTOMY;  Surgeon: Dasie Leonor CROME, MD;  Location: [facility];  Service: General;  Laterality: N/A;   COLONOSCOPY     polyp   CYSTOSCOPY WITH STENT PLACEMENT Left 11/14/2019   Procedure: CYSTOSCOPY AND LEFT URETERAL WITH STENT PLACEMENT retrograde pylegram;  Surgeon: Renda Glance, MD;  Location: [facility];  Service: Urology;  Laterality: Left;   CYSTOSCOPY/URETEROSCOPY/HOLMIUM LASER/STENT PLACEMENT Left 12/14/2019   Procedure: CYSTOSCOPY/RETROGRADE/URETEROSCOPY/STENT REMOVAL;  Surgeon: Renda Glance, MD;  Location: [facility];  Service: Urology;  Laterality: Left;   INCISION AND DRAINAGE FOOT Right 2019   LAPAROSCOPIC BILATERAL SALPINGO OOPHERECTOMY Bilateral 03/06/2017   Procedure: LAPAROSCOPIC Left Salpingectomy & OOPHORECTOMY, Right Oophorectomy;  Surgeon: Marilynn Nest, DO;  Location: [facility];  Service: Gynecology;  Laterality: Bilateral;   PILONIDAL CYST DRAINAGE  1980   tailbone fx & subsequent cyst with excision     UPPER GI ENDOSCOPY     WISDOM TOOTH EXTRACTION      ALLERGIES: No Known Allergies  FAMILY HISTORY:  Family History  Problem Relation Age of Onset   Heart failure Mother    COPD Mother    Cancer Mother        lung cancer   Other Mother        pituitary tumor   Breast cancer Maternal Grandmother 60       deceased 44   Heart attack Maternal Grandfather    Cancer Other        mat granmother's sisters: one with leukemia and 2 with kidney ca at older ages    SOCIAL HISTORY: Social History   Socioeconomic History   Marital status: Single    Spouse name: Not on file   Number of children: Not on file   Years of education:  Not on file   Highest education level: Not on file  Occupational History   Not on file  Tobacco Use   Smoking status: Never   Smokeless tobacco: Never  Vaping Use   Vaping status: Never Used  Substance and Sexual Activity   Alcohol use: No   Drug use: No   Sexual activity: Not Currently    Birth control/protection: None, Post-menopausal  Other Topics Concern   Not on file  Social History Narrative   Not on file   Social Drivers of Health   Financial Resource Strain: Low Risk  (08/30/2017)   [facility]   Overall Financial Resource Strain (CARDIA)    Difficulty of Paying Living Expenses: Not very hard  Food Insecurity: No Food Insecurity (12/26/2023)  [facility] from [facility]   Hunger Vital Sign    Within the past 12 months, you worried that your food would run out before you got the money to buy more.: Never true    Within the past 12 months, the food you bought just didn't last and you didn't have money to get more.: Never true  Transportation Needs: No Transportation Needs (12/26/2023)   Received from [facility] - Transportation    Lack of Transportation (Medical): No    Lack of Transportation (Non-Medical): No  Physical Activity: Inactive (08/30/2017)   Received from [facility]   Exercise Vital Sign    Days of Exercise per Week: 0 days    Minutes of Exercise per Session: 0 min  Stress: Stress Concern Present (08/30/2017)   Received from [facility] - Occupational Stress Questionnaire    Feeling of Stress : Very much  Social Connections: Somewhat Isolated (08/30/2017)   Received from [facility]   Social Connection and Isolation Panel    Frequency of Communication with Friends and Family: More than three times a week    Frequency of Social Gatherings with Friends and Family: Three times a week    Attends Religious Services: More than 4 times per year    Active Member of Clubs  or Organizations: No    Attends Banker Meetings: Never    Marital Status: Never married    MEDICATIONS:  Current Outpatient Medications  Medication Sig Dispense Refill   bifidobacterium infantis (ALIGN) capsule Take 1 capsule by mouth daily.     cyclobenzaprine  (FLEXERIL ) 10 MG tablet Take 10 mg by mouth 3 (three) times daily as needed for muscle spasms.     docusate sodium  (COLACE) 100 MG capsule Take 1 capsule (100 mg total) by mouth 2 (two) times daily. (Patient taking differently: Take 100 mg by mouth daily as needed for mild constipation.) 30 capsule 0   escitalopram  (LEXAPRO ) 20 MG tablet Take 20 mg by mouth daily.     furosemide (LASIX) 20 MG tablet Take 20 mg by mouth daily.     insulin  aspart (FIASP ) 100 UNIT/ML FlexTouch Pen Inject 6 Units into the skin 3 (three) times daily before meals. PLUS SLIDING SCALE PER PATIENT     insulin  degludec (TRESIBA FLEXTOUCH) 100 UNIT/ML FlexTouch Pen Inject 16 Units into the skin daily.     meloxicam (MOBIC) 15 MG tablet Take 15 mg by mouth daily as needed for pain.      ondansetron  (ZOFRAN -ODT) 4 MG disintegrating tablet Take 1 tablet (4 mg total) by mouth every 8 (eight) hours as needed for nausea or vomiting. 16 tablet 0   OZEMPIC, 0.25 OR 0.5 MG/DOSE, 2 MG/1.5ML SOPN Inject 1 mg into the skin once a week.     rosuvastatin  (CRESTOR ) 10 MG tablet Take 1 tablet (10 mg total) by mouth daily. NEED OV. (Patient taking differently: Take 10 mg by mouth daily.) 90 tablet 0   traZODone  (DESYREL ) 50 MG tablet Take 50 mg by mouth at bedtime as needed for sleep.      Vitamin D, Ergocalciferol, (DRISDOL) 1.25 MG (50000 UNIT) CAPS capsule Take 50,000 Units by mouth every 7 (seven) days.     No current facility-administered medications for this visit.    PHYSICAL EXAM: Vitals:   03/18/24 1016  BP: (!) 144/82  Pulse: 81  Resp: 20  SpO2: 99%  Weight: 193 lb  6.4 oz (87.7 kg)  Height: 5' 2 (1.575 m)   Body mass index is 35.37 kg/m.  Wt  Readings from Last 3 Encounters:  03/18/24 193 lb 6.4 oz (87.7 kg)  09/22/20 200 lb 2.8 oz (90.8 kg)  09/20/20 200 lb 3 oz (90.8 kg)    General: Well developed, well nourished female in no apparent distress.  HEENT: AT/[place], no external lesions.  Eyes: Conjunctiva clear and no icterus. Neck: Neck supple  Lungs: Respirations not labored Neurologic: Alert, oriented, normal speech Extremities / Skin: Dry.  Psychiatric: Does not appear depressed or anxious  Diabetic Foot Exam - Simple   Simple Foot Form Diabetic Foot exam was performed with the following findings: Yes 03/18/2024 10:43 AM  Visual Inspection See comments: Yes Sensation Testing See comments: Yes Pulse Check Posterior Tibialis and Dorsalis pulse intact bilaterally: Yes Comments Monofilament exam absent bilateral toes, absent on right sole and diminished on left sole.  No ulcer. Hammer toes deformity.     LABS Reviewed Lab Results  Component Value Date   HGBA1C 7.1 (A) 03/18/2024   HGBA1C 9.8 (H) 09/20/2020   HGBA1C 8.4 (H) 12/10/2019   No results found for: FRUCTOSAMINE No results found for: CHOL, HDL, LDLCALC, LDLDIRECT, TRIG, CHOLHDL No results found for: MICRALBCREAT Lab Results  Component Value Date   CREATININE 0.96 05/09/2021   No results found for: GFR  ASSESSMENT / PLAN  1. Uncontrolled type 2 diabetes mellitus with hyperglycemia, with long-term current use of insulin  (HCC)     Diabetes Mellitus type 2, complicated by type retinopathy, neuropathy. - Diabetic status / severity: Uncontrolled.  Lab Results  Component Value Date   HGBA1C 7.1 (A) 03/18/2024    - Hemoglobin A1c goal : <6.5%  Discussed about type 2 diabetes mellitus and potential diabetic complication.  Discussed about importance of keeping controlled blood sugar.  She has fair control of diabetes.  - Medications: See below.  I) increase Tresiba from 26 to 30 units daily. II) continue Ozempic 2 mg  weekly. III) advised to take Fiasp  6 units with supper which is her largest meal.  Patient has been getting these diabetic medications through patient assistance program.  Patient will not get Ozempic from January from patient assistance.  She is going to set up insurance plan for the next year, she will try to include GLP-1 receptor agonist in the plan.  Patient will bring form for Tresiba patient assistance to complete.  Will plan for GLP-1 receptor agonist from January whichever is cost effective and covered by her new Medicare plan.  - Home glucose testing: CGM and check blood sugar as needed. - Discussed/ Gave Hypoglycemia treatment plan.  # Consult : not required at this time.   # Annual urine for microalbuminuria/ creatinine ratio, no microalbuminuria currently, continue ACE/ARB /olmesartan.  Will check urine microalbumin creatinine ratio today. Last No results found for: MICRALBCREAT  # Foot check nightly / neuropathy, continue duloxetine, managed by PCP.  Had tried gabapentin  and Lyrica in the past.  # She has proliferative diabetic retinopathy, regularly following with ophthalmology.   - Diet: Make healthy diabetic food choices, discussed in detail. - Life style / activity / exercise: Discussed.  2. Blood pressure  -  BP Readings from Last 1 Encounters:  03/18/24 (!) 144/82    - Control is in target.  - No change in current plans.  3. Lipid status / Hyperlipidemia - Last No results found for: LDLCALC LDL was 94 in August 2025. - Continue  rosuvastatin  10 mg daily.  Managed by PCP.  Diagnoses and all orders for this visit:  Uncontrolled type 2 diabetes mellitus with hyperglycemia, with long-term current use of insulin  (HCC) -     POCT glycosylated hemoglobin (Hb A1C) -     Microalbumin / creatinine urine ratio    DISPOSITION Follow up in clinic in 3 months suggested.   All questions answered and patient verbalized understanding of the plan.  Tobias Avitabile,  MD [facility] Endocrinology [facility] [address] [place], [address] [place], [place] [address] Phone # [phone]  At least part of this note was generated using voice recognition software. Inadvertent word errors may have occurred, which were not recognized during the proofreading process.

## 2024-03-18 NOTE — Patient Instructions (Signed)
 Diabetes regimen: Tresiba 30 units daily in the  morning.   Continue Ozempic 2 mg weekly.  Take Fiasp  6 units with largest meal of the day.

## 2024-03-19 LAB — MICROALBUMIN / CREATININE URINE RATIO
Creatinine, Urine: 110 mg/dL (ref 20–275)
Microalb Creat Ratio: 4 mg/g{creat} (ref <30)
Microalb, Ur: 0.4 mg/dL

## 2024-03-20 ENCOUNTER — Encounter: Payer: Self-pay | Admitting: Endocrinology

## 2024-06-25 ENCOUNTER — Ambulatory Visit: Admitting: Endocrinology
